# Patient Record
Sex: Female | Born: 1995 | Race: White | Hispanic: No | Marital: Single | Smoking: Never smoker
Health system: Southern US, Community
[De-identification: ages and names within clinical notes are randomized; demographics above are authoritative.]

---
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
## 2014-03-01 ENCOUNTER — Other Ambulatory Visit: Payer: Self-pay | Admitting: Family Medicine

## 2014-06-19 ENCOUNTER — Other Ambulatory Visit: Payer: Self-pay | Admitting: Family Medicine

## 2014-06-19 NOTE — Telephone Encounter (Signed)
Please schedule f/u and refill until then  

## 2014-06-19 NOTE — Telephone Encounter (Signed)
Electronic refill request, please advise  

## 2014-06-21 NOTE — Telephone Encounter (Signed)
Left voicemail requesting pt to call office back 

## 2014-06-22 NOTE — Telephone Encounter (Signed)
appt scheduled and med refilled 

## 2014-07-03 ENCOUNTER — Encounter: Payer: Self-pay | Admitting: Family Medicine

## 2014-07-03 ENCOUNTER — Ambulatory Visit (INDEPENDENT_AMBULATORY_CARE_PROVIDER_SITE_OTHER): Payer: 59 | Admitting: Family Medicine

## 2014-07-03 VITALS — BP 116/68 | HR 69 | Temp 98.7°F | Ht 66.25 in | Wt 172.8 lb

## 2014-07-03 DIAGNOSIS — Z23 Encounter for immunization: Secondary | ICD-10-CM

## 2014-07-03 DIAGNOSIS — Z113 Encounter for screening for infections with a predominantly sexual mode of transmission: Secondary | ICD-10-CM

## 2014-07-03 DIAGNOSIS — L700 Acne vulgaris: Secondary | ICD-10-CM

## 2014-07-03 MED ORDER — LEVONORGESTREL-ETHINYL ESTRAD 0.1-20 MG-MCG PO TABS: 1.0000 | ORAL_TABLET | Freq: Every day | ORAL | Status: DC

## 2014-07-03 NOTE — Progress Notes (Signed)
Pre visit review using our clinic review tool, if applicable. No additional management support is needed unless otherwise documented below in the visit note. 

## 2014-07-03 NOTE — Assessment & Plan Note (Signed)
Refilled OC -was improved  Now out of it and acne on chin returned Refilled pill  Rev need for condoms for STD prev

## 2014-07-03 NOTE — Progress Notes (Signed)
Subjective:    Patient ID: Danielle Mendoza, female    DOB: 11/13/1995, 18 y.o.   MRN: [id number]  HPI Here for f/u of acne and also for well health  She is in [employer]ology school and loves it  Working also   Will get a flu shot today   Is on aviane for acne  Ran out and her chin acne came back  She is sexually active and uses condoms  Would like std screening  - no symptoms or susp of dz   2 weeks without birth control - using condoms  No missed menses   Periods last about 4 days - not too heavy and not painful   Does eat healthy  Tries to stay active    Patient Active Problem List   Diagnosis Date Noted  . Mononucleosis syndrome 12/14/2013  . Well adolescent visit 11/24/2012  . Acne 11/11/2011  . CARDIAC MURMUR 09/05/2008  . ASTHMA 12/07/2006   No past medical history on file. No past surgical history on file. History  Substance Use Topics  . Smoking status: Never Smoker   . Smokeless tobacco: Never Used  . Alcohol Use: No   No family history on file. No Known Allergies Current Outpatient Prescriptions on File Prior to Visit  Medication Sig Dispense Refill  . AVIANE 0.1-20 MG-MCG tablet TAKE ONE TABLET BY MOUTH ONCE DAILY 84 tablet 0   No current facility-administered medications on file prior to visit.     Review of Systems Review of Systems  Constitutional: Negative for fever, appetite change, fatigue and unexpected weight change.  Eyes: Negative for pain and visual disturbance.  Respiratory: Negative for cough and shortness of breath.   Cardiovascular: Negative for cp or palpitations    Gastrointestinal: Negative for nausea, diarrhea and constipation.  Genitourinary: Negative for urgency and frequency.  Skin: Negative for pallor or rash  pos for acne Neurological: Negative for weakness, light-headedness, numbness and headaches.  Hematological: Negative for adenopathy. Does not bruise/bleed easily.  Psychiatric/Behavioral: Negative for dysphoric mood.  The patient is not nervous/anxious.         Objective:   Physical Exam  Constitutional: She appears well-developed and well-nourished. No distress.  HENT:  Head: Normocephalic and atraumatic.  Mouth/Throat: Oropharynx is clear and moist.  Eyes: Conjunctivae and EOM are normal. Pupils are equal, round, and reactive to light. No scleral icterus.  Neck: Normal range of motion. Neck supple. No thyromegaly present.  Cardiovascular: Normal rate and intact distal pulses.   Murmur heard. Pulmonary/Chest: Effort normal and breath sounds normal. No respiratory distress. She has no wheezes. She has no rales.  Abdominal: Soft. Bowel sounds are normal. She exhibits no distension and no mass. There is no tenderness.  No suprapubic tenderness or fullness    Musculoskeletal: She exhibits no edema.  Lymphadenopathy:    She has no cervical adenopathy.  Neurological: She is alert. She has normal reflexes. No cranial nerve deficit. She exhibits normal muscle tone. Coordination normal.  Skin: Skin is warm and dry. No pallor.  Mild comedonal acne on chin  Psychiatric: She has a normal mood and affect.          Assessment & Plan:   Problem List Items Addressed This Visit      Musculoskeletal and Integument   Acne - Primary    Refilled OC -was improved  Now out of it and acne on chin returned Refilled pill  Rev need for condoms for STD prev  Relevant Medications      levonorgestrel-ethinyl estradiol (AVIANE) 0.1-20 MG-MCG tablet     Other   Screen for STD (sexually transmitted disease)    Gc/chlam screen/urine  Disc use of condoms No symptoms     Relevant Orders      GC/chlamydia probe amp, urine    Other Visit Diagnoses    Need for prophylactic vaccination and inoculation against influenza        Relevant Orders       Flu Vaccine QUAD 36+ mos PF IM (Fluarix Quad PF) (Completed)

## 2014-07-03 NOTE — Assessment & Plan Note (Signed)
Gc/chlam screen/urine  Disc use of condoms No symptoms

## 2014-07-03 NOTE — Patient Instructions (Signed)
Continue current OC  Flu shot today  Urine for std screen today  Take care of yourself   If any problems let me know

## 2014-07-04 LAB — GC/CHLAMYDIA PROBE AMP, URINE
CHLAMYDIA, SWAB/URINE, PCR: NEGATIVE
GC Probe Amp, Urine: NEGATIVE

## 2014-07-05 ENCOUNTER — Telehealth: Payer: Self-pay | Admitting: Family Medicine

## 2014-07-05 NOTE — Telephone Encounter (Signed)
Addressed through result notes  

## 2014-07-05 NOTE — Telephone Encounter (Signed)
Patient returned your call.

## 2014-11-07 ENCOUNTER — Telehealth: Payer: Self-pay

## 2014-11-07 NOTE — Telephone Encounter (Signed)
Danielle Mendoza with [facility] [place] request refill Aviane. Advised to ck with pt who received a printed rx on 07/04/15. Danielle Mendoza voiced understanding.

## 2014-12-05 ENCOUNTER — Other Ambulatory Visit: Payer: Self-pay

## 2014-12-05 MED ORDER — LEVONORGESTREL-ETHINYL ESTRAD 0.1-20 MG-MCG PO TABS: 1.0000 | ORAL_TABLET | Freq: Every day | ORAL | Status: DC

## 2014-12-05 NOTE — Telephone Encounter (Signed)
Pts mother left v/m; pt received BC pill rx 07/03/14 but has lost the written rx. Now requesting refill aviane to [facility] [address] [place]. Pt now living in [place]. Request cb when refilled.

## 2014-12-05 NOTE — Telephone Encounter (Signed)
Will refill electronically  

## 2014-12-05 NOTE — Telephone Encounter (Signed)
Called mother's phone and no answer and no voicemail set up so called pt and left voicemail letting her know Rx sent to pharmacy

## 2016-05-21 ENCOUNTER — Ambulatory Visit (INDEPENDENT_AMBULATORY_CARE_PROVIDER_SITE_OTHER): Payer: No Typology Code available for payment source | Admitting: Family Medicine

## 2016-05-21 ENCOUNTER — Encounter: Payer: Self-pay | Admitting: Family Medicine

## 2016-05-21 VITALS — BP 120/64 | HR 88 | Temp 98.7°F | Ht 66.0 in | Wt 189.5 lb

## 2016-05-21 DIAGNOSIS — J029 Acute pharyngitis, unspecified: Secondary | ICD-10-CM | POA: Diagnosis not present

## 2016-05-21 DIAGNOSIS — J020 Streptococcal pharyngitis: Secondary | ICD-10-CM | POA: Insufficient documentation

## 2016-05-21 LAB — POCT RAPID STREP A (OFFICE): Rapid Strep A Screen: POSITIVE — AB

## 2016-05-21 MED ORDER — AMOXICILLIN 500 MG PO CAPS: 500.0000 mg | ORAL_CAPSULE | Freq: Three times a day (TID) | ORAL | 0 refills | Status: DC

## 2016-05-21 MED ORDER — LEVONORGESTREL-ETHINYL ESTRAD 0.1-20 MG-MCG PO TABS: 1.0000 | ORAL_TABLET | Freq: Every day | ORAL | 0 refills | Status: DC

## 2016-05-21 NOTE — Progress Notes (Signed)
   Subjective:    Patient ID: Danielle Mendoza, female    DOB: 09/17/1995, 20 y.o.   MRN: [id number]  HPI  Here with pharyngitis   Woke up yesterday with a bad ST  Has not checked temp  No chills Body has ached all over  Very tired  Lymph nodes in her neck are sore  A little cough today -not a lot   Can swallow  Drinking fluids  No tylenol or ibuprofen   Pos RST today Results for orders placed or performed in visit on 05/21/16  POCT rapid strep A  Result Value Ref Range   Rapid Strep A Screen Positive (A) Negative     Patient Active Problem List   Diagnosis Date Noted  . Strep pharyngitis 05/21/2016  . Screen for STD (sexually transmitted disease) 07/03/2014  . Mononucleosis syndrome 12/14/2013  . Well adolescent visit 11/24/2012  . Acne 11/11/2011  . CARDIAC MURMUR 09/05/2008  . ASTHMA 12/07/2006   No past medical history on file. No past surgical history on file. Social History  Substance Use Topics  . Smoking status: Never Smoker  . Smokeless tobacco: Never Used  . Alcohol use No   No family history on file. No Known Allergies No current outpatient prescriptions on file prior to visit.   No current facility-administered medications on file prior to visit.     Review of Systems    Review of Systems  Constitutional: Negative for fever, appetite change, fatigue and unexpected weight change.  Eyes: Negative for pain and visual disturbance.  Respiratory: Negative for cough and shortness of breath.   Cardiovascular: Negative for cp or palpitations    Gastrointestinal: Negative for nausea, diarrhea and constipation.  Genitourinary: Negative for urgency and frequency. pos for OC use - with problems remembering to take daily Skin: Negative for pallor or rash   Neurological: Negative for weakness, light-headedness, numbness and headaches.  Hematological: Negative for adenopathy. Does not bruise/bleed easily.  Psychiatric/Behavioral: Negative for dysphoric mood.  The patient is not nervous/anxious.      Objective:   Physical Exam  Constitutional: She appears well-developed and well-nourished. No distress.  HENT:  Head: Normocephalic and atraumatic.  Right Ear: External ear normal.  Left Ear: External ear normal.  Nose: Nose normal.  Erythematous throat with mildly enl tonsils No exudate or swelling    Eyes: Conjunctivae and EOM are normal. Pupils are equal, round, and reactive to light. Right eye exhibits no discharge. Left eye exhibits no discharge.  Neck: Normal range of motion. Neck supple.  Cardiovascular:  Murmur heard. Pulmonary/Chest: Effort normal and breath sounds normal. No respiratory distress. She has no wheezes. She has no rales.  Lymphadenopathy:    She has no cervical adenopathy.  Skin: Skin is warm and dry. No rash noted. No erythema. No pallor.  Psychiatric: She has a normal mood and affect.          Assessment & Plan:   Problem List Items Addressed This Visit      Respiratory   Strep pharyngitis    Cover with amoxicillin  Fluids/rest  Disc symptomatic care - see instructions on AVS  Update if not starting to improve in a week or if worsening   Out of work for 24 h with abx       Other Visit Diagnoses    Sore throat    -  Primary   Relevant Orders   POCT rapid strep A (Completed)

## 2016-05-21 NOTE — Progress Notes (Signed)
Pre visit review using our clinic review tool, if applicable. No additional management support is needed unless otherwise documented below in the visit note. 

## 2016-05-21 NOTE — Patient Instructions (Addendum)
Drink lots of fluids and rest  You have strep throat  Take acetaminophen or ibuprofen (with food ) for sore throat and fever  Take the amoxicillin as directed  Gargle with salt water Chloraseptic throat spray over the counter is helpful  Update if not starting to improve in a week or if worsening    I refilled your oral contraceptive Set an alarm on your phone to go off daily as a reminder  Either follow up with me for a visit for gyn exam or if you would rather see gyn (to consider another option)- let me know and I will refer you

## 2016-05-22 NOTE — Assessment & Plan Note (Signed)
Cover with amoxicillin  Fluids/rest  Disc symptomatic care - see instructions on AVS  Update if not starting to improve in a week or if worsening   Out of work for 24 h with abx

## 2016-11-03 ENCOUNTER — Encounter: Payer: Self-pay | Admitting: Family Medicine

## 2016-11-03 ENCOUNTER — Other Ambulatory Visit (HOSPITAL_COMMUNITY)
Admission: RE | Admit: 2016-11-03 | Discharge: 2016-11-03 | Disposition: A | Discharge status: Home / Self Care | Payer: No Typology Code available for payment source | Source: Ambulatory Visit | Attending: Family Medicine | Admitting: Family Medicine

## 2016-11-03 ENCOUNTER — Ambulatory Visit (INDEPENDENT_AMBULATORY_CARE_PROVIDER_SITE_OTHER): Payer: No Typology Code available for payment source | Admitting: Family Medicine

## 2016-11-03 VITALS — BP 102/62 | HR 72 | Temp 98.2°F | Ht 66.5 in | Wt 189.0 lb

## 2016-11-03 DIAGNOSIS — Z0000 Encounter for general adult medical examination without abnormal findings: Secondary | ICD-10-CM

## 2016-11-03 DIAGNOSIS — Z23 Encounter for immunization: Secondary | ICD-10-CM

## 2016-11-03 DIAGNOSIS — Z01419 Encounter for gynecological examination (general) (routine) without abnormal findings: Secondary | ICD-10-CM

## 2016-11-03 DIAGNOSIS — Z113 Encounter for screening for infections with a predominantly sexual mode of transmission: Secondary | ICD-10-CM | POA: Diagnosis not present

## 2016-11-03 MED ORDER — LEVONORGESTREL-ETHINYL ESTRAD 0.1-20 MG-MCG PO TABS: 1.0000 | ORAL_TABLET | Freq: Every day | ORAL | 3 refills | Status: DC

## 2016-11-03 NOTE — Assessment & Plan Note (Signed)
No symptoms  Not sexually active now  gc/chlamydia tests added to pap HIV and RPR in blood draw

## 2016-11-03 NOTE — Progress Notes (Signed)
Pre visit review using our clinic review tool, if applicable. No additional management support is needed unless otherwise documented below in the visit note. 

## 2016-11-03 NOTE — Progress Notes (Signed)
Subjective:    Patient ID: Danielle Mendoza, female    DOB: Sep 21, 1995, 21 y.o.   MRN: [id number]  HPI  Here for health maintenance exam and to review chronic medical problems    Working - not a lot going on  At a salon in [place]  Getting to busy season - enjoys it for the most part   Wt Readings from Last 3 Encounters:  11/03/16 189 lb (85.7 kg)  05/21/16 189 lb 8 oz (86 kg)  07/03/14 172 lb 12 oz (78.4 kg) (93 %, Z= 1.46)*   * Growth percentiles are based on CDC 2-20 Years data.   bmi 30.0 Eating healthy- making improvements - quit soda/ lower carb /more water  Eating better carbs  Going to the gym 3 times per week- likes cardio     Gyn care-wants to get that done today She is not sexually active now (was in the past)  Interested in std testing -no symptoms Menses -normal lasting approx 5 days at most , not heavy or painful  On aviane -likes that one   Had HPV vaccines   HIV screening   Flu shot - did not get one this past season   Tetanus shot 10/08- will get done today   Patient Active Problem List   Diagnosis Date Noted  . Routine general medical examination at a health care facility 11/03/2016  . Encounter for routine gynecological examination 11/03/2016  . Screen for STD (sexually transmitted disease) 07/03/2014  . Mononucleosis syndrome 12/14/2013  . Well adolescent visit 11/24/2012  . Acne 11/11/2011  . CARDIAC MURMUR 09/05/2008  . ASTHMA 12/07/2006   No past medical history on file. No past surgical history on file. Social History  Substance Use Topics  . Smoking status: Never Smoker  . Smokeless tobacco: Never Used  . Alcohol use No   No family history on file. No Known Allergies No current outpatient prescriptions on file prior to visit.   No current facility-administered medications on file prior to visit.     Review of Systems Review of Systems  Constitutional: Negative for fever, appetite change, fatigue and unexpected weight  change.  Eyes: Negative for pain and visual disturbance.  Respiratory: Negative for cough and shortness of breath.   Cardiovascular: Negative for cp or palpitations    Gastrointestinal: Negative for nausea, diarrhea and constipation.  Genitourinary: Negative for urgency and frequency.  Skin: Negative for pallor or rash   Neurological: Negative for weakness, light-headedness, numbness and headaches.  Hematological: Negative for adenopathy. Does not bruise/bleed easily.  Psychiatric/Behavioral: Negative for dysphoric mood. The patient is not nervous/anxious.         Objective:   Physical Exam  Constitutional: She appears well-developed and well-nourished. No distress.  overwt and well app  HENT:  Head: Normocephalic and atraumatic.  Right Ear: External ear normal.  Left Ear: External ear normal.  Mouth/Throat: Oropharynx is clear and moist.  Eyes: Conjunctivae and EOM are normal. Pupils are equal, round, and reactive to light. No scleral icterus.  Neck: Normal range of motion. Neck supple. No JVD present. Carotid bruit is not present. No thyromegaly present.  Cardiovascular: Normal rate, regular rhythm and intact distal pulses.  Exam reveals no gallop.   Murmur heard. Pulmonary/Chest: Effort normal and breath sounds normal. No respiratory distress. She has no wheezes. She exhibits no tenderness.  Abdominal: Soft. Bowel sounds are normal. She exhibits no distension, no abdominal bruit and no mass. There is no tenderness.  Genitourinary:  No breast swelling, tenderness, discharge or bleeding.  Genitourinary Comments: Breast exam: No mass, nodules, thickening, tenderness, bulging, retraction, inflamation, nipple discharge or skin changes noted.  No axillary or clavicular LA.             Anus appears normal w/o hemorrhoids or masses     External genitalia : nl appearance and hair distribution/no lesions     Urethral meatus : nl size, no lesions or prolapse     Urethra: no  masses, tenderness or scarring    Bladder : no masses or tenderness     Vagina: nl general appearance, no discharge or  Lesions, no significant cystocele  or rectocele     Cervix: no lesions/ discharge or friability    Uterus: nl size, contour, position, and mobility (not fixed) , non tender    Adnexa : no masses, tenderness, enlargement or nodularity        Musculoskeletal: Normal range of motion. She exhibits no edema or tenderness.  Lymphadenopathy:    She has no cervical adenopathy.  Neurological: She is alert. She has normal reflexes. No cranial nerve deficit. She exhibits normal muscle tone. Coordination normal.  Skin: Skin is warm and dry. No rash noted. No erythema. No pallor.  Solar lentigines diffusely    Psychiatric: She has a normal mood and affect.          Assessment & Plan:   Problem List Items Addressed This Visit      Other   Encounter for routine gynecological examination    Routine gyn exam with first pap no c/o  Has had hpv vaccines Std screening done  Will continue current OC Disc std prev      Relevant Orders   Cytology - PAP   Routine general medical examination at a health care facility - Primary    Reviewed health habits including diet and exercise and skin cancer prevention Reviewed appropriate screening tests for age  Also reviewed health mt list, fam hx and immunization status , as well as social and family history   See hpi Wellness labs today STD screening  Disc healthy habits        Relevant Orders   CBC with Differential/Platelet   Comprehensive metabolic panel   Lipid panel   TSH   Screen for STD (sexually transmitted disease)    No symptoms  Not sexually active now  gc/chlamydia tests added to pap HIV and RPR in blood draw       Relevant Orders   HIV antibody   RPR    Other Visit Diagnoses    Need for Tdap vaccination       Relevant Orders   Tdap vaccine greater than or equal to 7yo IM (Completed)

## 2016-11-03 NOTE — Assessment & Plan Note (Signed)
Reviewed health habits including diet and exercise and skin cancer prevention Reviewed appropriate screening tests for age  Also reviewed health mt list, fam hx and immunization status , as well as social and family history   See hpi Wellness labs today STD screening  Disc healthy habits

## 2016-11-03 NOTE — Assessment & Plan Note (Signed)
Routine gyn exam with first pap no c/o  Has had hpv vaccines Std screening done  Will continue current OC Disc std prev

## 2016-11-03 NOTE — Patient Instructions (Addendum)
Take care of yourself- eat a healthy diet and keep exercising Labs today for wellness Also for STD screen  Pap and gyn exam today  Continue current oral contraceptive-I sent it to [facility] Tdap vaccine today

## 2016-11-04 LAB — LIPID PANEL
Cholesterol: 132 mg/dL (ref 0–200)
HDL: 45 mg/dL (ref >39.00)
LDL Cholesterol: 69 mg/dL (ref 0–99)
NONHDL: 87.3
TRIGLYCERIDES: 90 mg/dL (ref 0.0–149.0)
Total CHOL/HDL Ratio: 3
VLDL: 18 mg/dL (ref 0.0–40.0)

## 2016-11-04 LAB — COMPREHENSIVE METABOLIC PANEL
ALK PHOS: 39 U/L (ref 39–117)
ALT: 13 U/L (ref 0–35)
AST: 17 U/L (ref 0–37)
Albumin: 4.3 g/dL (ref 3.5–5.2)
BILIRUBIN TOTAL: 0.3 mg/dL (ref 0.2–1.2)
BUN: 11 mg/dL (ref 6–23)
CALCIUM: 9.3 mg/dL (ref 8.4–10.5)
CO2: 28 mEq/L (ref 19–32)
Chloride: 107 mEq/L (ref 96–112)
Creatinine, Ser: 0.67 mg/dL (ref 0.40–1.20)
GFR: 117.22 mL/min (ref >60.00)
GLUCOSE: 99 mg/dL (ref 70–99)
Potassium: 4 mEq/L (ref 3.5–5.1)
Sodium: 141 mEq/L (ref 135–145)
TOTAL PROTEIN: 7 g/dL (ref 6.0–8.3)

## 2016-11-04 LAB — CBC WITH DIFFERENTIAL/PLATELET
BASOS ABS: 0.1 10*3/uL (ref 0.0–0.1)
Basophils Relative: 0.6 % (ref 0.0–3.0)
Eosinophils Absolute: 0.3 10*3/uL (ref 0.0–0.7)
Eosinophils Relative: 3.3 % (ref 0.0–5.0)
HCT: 39.2 % (ref 36.0–46.0)
Hemoglobin: 12.8 g/dL (ref 12.0–15.0)
Lymphocytes Relative: 27.4 % (ref 12.0–46.0)
Lymphs Abs: 2.6 10*3/uL (ref 0.7–4.0)
MCHC: 32.6 g/dL (ref 30.0–36.0)
MCV: 87.4 fl (ref 78.0–100.0)
MONO ABS: 0.5 10*3/uL (ref 0.1–1.0)
Monocytes Relative: 5.6 % (ref 3.0–12.0)
Neutro Abs: 6.1 10*3/uL (ref 1.4–7.7)
Neutrophils Relative %: 63.1 % (ref 43.0–77.0)
Platelets: 218 10*3/uL (ref 150.0–400.0)
RBC: 4.49 Mil/uL (ref 3.87–5.11)
RDW: 13.9 % (ref 11.5–15.5)
WBC: 9.6 10*3/uL (ref 4.0–10.5)

## 2016-11-04 LAB — CYTOLOGY - PAP
Chlamydia: NEGATIVE
DIAGNOSIS: NEGATIVE
NEISSERIA GONORRHEA: NEGATIVE

## 2016-11-04 LAB — RPR

## 2016-11-04 LAB — TSH: TSH: 1.14 u[IU]/mL (ref 0.35–4.50)

## 2016-11-04 LAB — HIV ANTIBODY (ROUTINE TESTING W REFLEX): HIV 1&2 Ab, 4th Generation: NONREACTIVE

## 2017-05-13 ENCOUNTER — Ambulatory Visit (INDEPENDENT_AMBULATORY_CARE_PROVIDER_SITE_OTHER): Payer: No Typology Code available for payment source | Admitting: Primary Care

## 2017-05-13 ENCOUNTER — Encounter: Payer: Self-pay | Admitting: Primary Care

## 2017-05-13 VITALS — BP 122/76 | HR 91 | Temp 98.2°F | Ht 66.5 in | Wt 189.4 lb

## 2017-05-13 DIAGNOSIS — J029 Acute pharyngitis, unspecified: Secondary | ICD-10-CM | POA: Diagnosis not present

## 2017-05-13 DIAGNOSIS — J069 Acute upper respiratory infection, unspecified: Secondary | ICD-10-CM

## 2017-05-13 LAB — POCT RAPID STREP A (OFFICE): Rapid Strep A Screen: NEGATIVE

## 2017-05-13 NOTE — Addendum Note (Signed)
Addended by: Tawnya CrookSAMBATH, Morgane Joerger on: 05/13/2017 12:55 PM   Modules accepted: Orders

## 2017-05-13 NOTE — Progress Notes (Signed)
   Subjective:    Patient ID: Danielle Mendoza E Schumm, female    DOB: 10/25/1995, 21 y.o.   MRN: [id number]  HPI  Ms. Joesphine BareGreeson is a 21 year old female who presents today with a chief complaint of sore throat. She also reports body aches, nasal congestion, chills, body aches. Her symptoms began 2 days ago. She denies cough, fevers, sick contacts, sinus pressure. She's taken Advil with some improvement with headache. Her most bothersome symptom is sore throat.  Review of Systems  Constitutional: Positive for chills and fatigue. Negative for fever.  HENT: Positive for congestion and sore throat. Negative for ear pain and sinus pressure.   Respiratory: Negative for cough and shortness of breath.   Allergic/Immunologic: Positive for environmental allergies.       No past medical history on file.   Social History   Social History  . Marital status: Single    Spouse name: N/A  . Number of children: N/A  . Years of education: N/A   Occupational History  . Not on file.   Social History Main Topics  . Smoking status: Never Smoker  . Smokeless tobacco: Never Used  . Alcohol use No  . Drug use: No  . Sexual activity: Not on file   Other Topics Concern  . Not on file   Social History Narrative  . No narrative on file    No past surgical history on file.  No family history on file.  No Known Allergies  Current Outpatient Prescriptions on File Prior to Visit  Medication Sig Dispense Refill  . levonorgestrel-ethinyl estradiol (AVIANE) 0.1-20 MG-MCG tablet Take 1 tablet by mouth daily. 84 tablet 3   No current facility-administered medications on file prior to visit.     BP 122/76   Pulse 91   Temp 98.2 F (36.8 C) (Oral)   Ht 5' 6.5" (1.689 m)   Wt 189 lb 6.4 oz (85.9 kg)   LMP 05/13/2017   SpO2 98%   BMI 30.11 kg/m    Objective:   Physical Exam  Constitutional: She appears well-nourished. She does not appear ill.  HENT:  Right Ear: Ear canal normal. Tympanic membrane is  not erythematous. A middle ear effusion is present.  Left Ear: Ear canal normal. Tympanic membrane is not erythematous. A middle ear effusion is present.  Nose: Mucosal edema present. Right sinus exhibits no maxillary sinus tenderness and no frontal sinus tenderness. Left sinus exhibits no maxillary sinus tenderness and no frontal sinus tenderness.  Mouth/Throat: Posterior oropharyngeal erythema present. No oropharyngeal exudate or posterior oropharyngeal edema.  Eyes: Conjunctivae are normal.  Neck: Neck supple.  Cardiovascular: Normal rate and regular rhythm.   Pulmonary/Chest: Effort normal and breath sounds normal. She has no wheezes. She has no rales.  Lymphadenopathy:    She has no cervical adenopathy.  Skin: Skin is warm and dry.          Assessment & Plan:  URI:  Sore throat, chills, body aches x 2 days. Exam today consistent for viral involvement without influenza. Rapid Strep: Negative. Discussed antihistamine/flonase use for ear effusion. Tylenol or Advil for body aches, headaches. Discussed that symptoms may get worse before better and return precautions provided if no improvement. Fluids, rest.  Morrie Sheldonlark,Glada Wickstrom Kendal, NP

## 2017-05-13 NOTE — Patient Instructions (Signed)
Your symptoms are representative of a viral illness which will resolve on its own over time. Our goal is to treat your symptoms in order to aid your body in the healing process and to make you more comfortable.   Consider starting an antihistamine such as Claritin, Zyrtec, or Allegra for fluid in the ears.   Nasal Congestion/Ear Pressure: Try using Flonase (fluticasone) nasal spray. Instill 1 spray in each nostril twice daily.   Continue Advil or Tylenol as needed for headaches and body aches. Do not exceed 2400 mg of Advil or 3000 mg of tylenol in 24 hours.  Please notify me if you develop persistent fevers of 101, start coughing up green mucous, notice increased fatigue or weakness, or feel worse after 1 week of onset of symptoms.   Increase consumption of water intake and rest.  It was a pleasure meeting you!   Upper Respiratory Infection, Adult Most upper respiratory infections (URIs) are a viral infection of the air passages leading to the lungs. A URI affects the nose, throat, and upper air passages. The most common type of URI is nasopharyngitis and is typically referred to as "the common cold." URIs run their course and usually go away on their own. Most of the time, a URI does not require medical attention, but sometimes a bacterial infection in the upper airways can follow a viral infection. This is called a secondary infection. Sinus and middle ear infections are common types of secondary upper respiratory infections. Bacterial pneumonia can also complicate a URI. A URI can worsen asthma and chronic obstructive pulmonary disease (COPD). Sometimes, these complications can require emergency medical care and may be life threatening. What are the causes? Almost all URIs are caused by viruses. A virus is a type of germ and can spread from one person to another. What increases the risk? You may be at risk for a URI if:  You smoke.  You have chronic heart or lung disease.  You have a  weakened defense (immune) system.  You are very young or very old.  You have nasal allergies or asthma.  You work in crowded or poorly ventilated areas.  You work in health care facilities or schools.  What are the signs or symptoms? Symptoms typically develop 2-3 days after you come in contact with a cold virus. Most viral URIs last 7-10 days. However, viral URIs from the influenza virus (flu virus) can last 14-18 days and are typically more severe. Symptoms may include:  Runny or stuffy (congested) nose.  Sneezing.  Cough.  Sore throat.  Headache.  Fatigue.  Fever.  Loss of appetite.  Pain in your forehead, behind your eyes, and over your cheekbones (sinus pain).  Muscle aches.  How is this diagnosed? Your health care provider may diagnose a URI by:  Physical exam.  Tests to check that your symptoms are not due to another condition such as: ? Strep throat. ? Sinusitis. ? Pneumonia. ? Asthma.  How is this treated? A URI goes away on its own with time. It cannot be cured with medicines, but medicines may be prescribed or recommended to relieve symptoms. Medicines may help:  Reduce your fever.  Reduce your cough.  Relieve nasal congestion.  Follow these instructions at home:  Take medicines only as directed by your health care provider.  Gargle warm saltwater or take cough drops to comfort your throat as directed by your health care provider.  Use a warm mist humidifier or inhale steam from a shower  to increase air moisture. This may make it easier to breathe.  Drink enough fluid to keep your urine clear or pale yellow.  Eat soups and other clear broths and maintain good nutrition.  Rest as needed.  Return to work when your temperature has returned to normal or as your health care provider advises. You may need to stay home longer to avoid infecting others. You can also use a face mask and careful hand washing to prevent spread of the  virus.  Increase the usage of your inhaler if you have asthma.  Do not use any tobacco products, including cigarettes, chewing tobacco, or electronic cigarettes. If you need help quitting, ask your health care provider. How is this prevented? The best way to protect yourself from getting a cold is to practice good hygiene.  Avoid oral or hand contact with people with cold symptoms.  Wash your hands often if contact occurs.  There is no clear evidence that vitamin C, vitamin E, echinacea, or exercise reduces the chance of developing a cold. However, it is always recommended to get plenty of rest, exercise, and practice good nutrition. Contact a health care provider if:  You are getting worse rather than better.  Your symptoms are not controlled by medicine.  You have chills.  You have worsening shortness of breath.  You have brown or red mucus.  You have yellow or brown nasal discharge.  You have pain in your face, especially when you bend forward.  You have a fever.  You have swollen neck glands.  You have pain while swallowing.  You have white areas in the back of your throat. Get help right away if:  You have severe or persistent: ? Headache. ? Ear pain. ? Sinus pain. ? Chest pain.  You have chronic lung disease and any of the following: ? Wheezing. ? Prolonged cough. ? Coughing up blood. ? A change in your usual mucus.  You have a stiff neck.  You have changes in your: ? Vision. ? Hearing. ? Thinking. ? Mood. This information is not intended to replace advice given to you by your health care provider. Make sure you discuss any questions you have with your health care provider. Document Released: 12/31/2000 Document Revised: 03/09/2016 Document Reviewed: 10/12/2013 Elsevier Interactive Patient Education  2017 [employer].

## 2017-06-18 DIAGNOSIS — T7840XA Allergy, unspecified, initial encounter: Secondary | ICD-10-CM | POA: Insufficient documentation

## 2017-06-18 DIAGNOSIS — J343 Hypertrophy of nasal turbinates: Secondary | ICD-10-CM | POA: Insufficient documentation

## 2017-11-09 ENCOUNTER — Telehealth: Payer: Self-pay

## 2017-11-09 ENCOUNTER — Encounter: Payer: Self-pay | Admitting: Family Medicine

## 2017-11-09 NOTE — Telephone Encounter (Signed)
Thanks - please cancel the appt  Wish I would have known earlier and we could have filled her slot Thanks

## 2017-11-09 NOTE — Telephone Encounter (Signed)
PLEASE NOTE: All timestamps contained within this report are represented as [place] Standard Time. CONFIDENTIALTY NOTICE: This fax transmission is intended only for the addressee. It contains information that is legally privileged, confidential or otherwise protected from use or disclosure. If you are not the intended recipient, you are strictly prohibited from reviewing, disclosing, copying using or disseminating any of this information or taking any action in reliance on or regarding this information. If you have received this fax in error, please notify us immediately by telephone so that we can arrange for its return to us. Phone: [phone], Toll-Free: [phone], Fax: [phone] Page: 1 of 1 Call Id: [id number] [facility] - Client Nonclinical Telephone Record [facility] Client [facility] - Night Contact Type Call Who Is Calling Patient / Member / Family / Caregiver Caller Name Danielle Mendoza Caller Phone Number [phone] Patient Name Danielle Mendoza Patient DOB [birth year] Call Type Message Only Information Provided Reason for Call Request to Reschedule Office Appointment Initial Comment Caller states that she has an appt tomorrow and is needing to reschedule her appt. Additional Comment Call Closed By: Jackelyn HoehnLorena Gonzalez Transaction Date/Time: 11/08/2017 7:26:42 PM (ET)

## 2017-11-09 NOTE — Telephone Encounter (Signed)
I left a message on patient's voice mail to call back and reschedule cpx appointment.  Dr.Tower do you want me to cancel today's appointment or leave it on your schedule?

## 2018-01-03 DIAGNOSIS — J019 Acute sinusitis, unspecified: Secondary | ICD-10-CM | POA: Diagnosis not present

## 2018-06-04 ENCOUNTER — Telehealth: Payer: Self-pay

## 2018-06-04 NOTE — Telephone Encounter (Signed)
Irving Burtonmily at front desk said my chart note that pt had scheduled a mychart physical on 06/08/18 for annual exam, birth control and shortness of breath concerns. I spoke with  pt and she said she has hx of asthma, for 2 months pt has been coughing; for 3 weeks pt has felt like her breathing is restricted. Pt does not sound in any distress. Pt said she is OK right now. Pt said she does not think when happens that she is in any distress but she can tell a difference in her breathing being more restricted. Pt does not think the breathing is bad enough that she needs to be seen prior to 06/08/18; 06/08/18 is first appt that was acceptable with pts work schedule. Pt called back and would like [facility] and on and off restricted breathing. If pt condition changes or worsens prior to appt [facility] FYI to [facility] Sh[facility] HonesGutierrez [facility] Dr Milinda Antisower as PCP.

## 2018-06-05 ENCOUNTER — Ambulatory Visit: Payer: Self-pay | Admitting: Family Medicine

## 2018-06-08 ENCOUNTER — Ambulatory Visit (INDEPENDENT_AMBULATORY_CARE_PROVIDER_SITE_OTHER): Payer: Commercial Managed Care - PPO | Admitting: Family Medicine

## 2018-06-08 ENCOUNTER — Other Ambulatory Visit (HOSPITAL_COMMUNITY)
Admission: RE | Admit: 2018-06-08 | Discharge: 2018-06-08 | Disposition: A | Discharge status: Home / Self Care | Payer: Commercial Managed Care - PPO | Source: Ambulatory Visit | Attending: Family Medicine | Admitting: Family Medicine

## 2018-06-08 ENCOUNTER — Encounter: Payer: Self-pay | Admitting: Family Medicine

## 2018-06-08 VITALS — BP 104/66 | HR 72 | Temp 97.9°F | Ht 66.25 in | Wt 195.5 lb

## 2018-06-08 DIAGNOSIS — Z23 Encounter for immunization: Secondary | ICD-10-CM | POA: Diagnosis not present

## 2018-06-08 DIAGNOSIS — Z01419 Encounter for gynecological examination (general) (routine) without abnormal findings: Secondary | ICD-10-CM | POA: Diagnosis not present

## 2018-06-08 DIAGNOSIS — N6315 Unspecified lump in the right breast, overlapping quadrants: Secondary | ICD-10-CM | POA: Insufficient documentation

## 2018-06-08 DIAGNOSIS — J4520 Mild intermittent asthma, uncomplicated: Secondary | ICD-10-CM | POA: Diagnosis not present

## 2018-06-08 DIAGNOSIS — Z0000 Encounter for general adult medical examination without abnormal findings: Secondary | ICD-10-CM

## 2018-06-08 DIAGNOSIS — Z113 Encounter for screening for infections with a predominantly sexual mode of transmission: Secondary | ICD-10-CM | POA: Diagnosis not present

## 2018-06-08 DIAGNOSIS — N6310 Unspecified lump in the right breast, unspecified quadrant: Secondary | ICD-10-CM

## 2018-06-08 MED ORDER — ALBUTEROL SULFATE HFA 108 (90 BASE) MCG/ACT IN AERS: 2.0000 | INHALATION_SPRAY | RESPIRATORY_TRACT | 11 refills | Status: DC | PRN

## 2018-06-08 MED ORDER — LEVONORGESTREL-ETHINYL ESTRAD 0.1-20 MG-MCG PO TABS: 1.0000 | ORAL_TABLET | Freq: Every day | ORAL | 3 refills | Status: DC

## 2018-06-08 NOTE — Assessment & Plan Note (Signed)
Reviewed health habits including diet and exercise and skin cancer prevention Reviewed appropriate screening tests for age  Also reviewed health mt list, fam hx and immunization status , as well as social and family history   See HPI Labs rev from last PE Gyn exam done with pap  Ref for US for breast lump on exam  Disc safe sexual practices and STD screening done

## 2018-06-08 NOTE — Assessment & Plan Note (Signed)
No symptoms  Enc safe sexual practices HIV , RPR, gc, chlamydia screen today as well as pap  Pt has had HPV vaccines

## 2018-06-08 NOTE — Assessment & Plan Note (Signed)
Suspect cyst  Ref done for [place]

## 2018-06-08 NOTE — Assessment & Plan Note (Signed)
Exam and pap done  Breast lump felt in L lat breast- US ordered  STD screen as well (gc/chlamydia) Refilled OC

## 2018-06-08 NOTE — Progress Notes (Signed)
Subjective:    Patient ID: Danielle Mendoza, female    DOB: 10/11/1995, 22 y.o.   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 If pollen allergies     Wt Readings from Last 3 Encounters:  06/08/18 195 lb 8 oz (88.7 kg)  05/13/17 189 lb 6.4 oz (85.9 kg)  11/03/16 189 lb (85.7 kg)  exercise- less due to her asthma lately / before that did cardio and some squats  Diet - working on it (good days and bad days)  31.32 kg/m   Sweets are her biggest problem  Hard to to eat on the go   Flu vaccine-plans to get today    Pap 4/18 -negative (was also screened for gc/chl) She has had the HPV vaccine series  OC- on aviane 1/20-wants to stick with it  Periods are fine - last about 4-5 days (not very heavy) , not painful or crampy occ sexually active   Wants STD screening   Tetanus shot 4/18   HIV screen neg 4/18  Lab profile in 2018 was unremarkable Lab Results  Component Value Date   CHOL 132 11/03/2016   HDL 45.00 11/03/2016   LDLCALC 69 11/03/2016   TRIG 90.0 11/03/2016   CHOLHDL 3 11/03/2016   Patient Active Problem List   Diagnosis Date Noted  . Breast lump on right side at 3 o'clock position 06/08/2018  . Routine general medical examination at a health care facility 11/03/2016  . Encounter for routine gynecological examination 11/03/2016  . Screen for STD (sexually transmitted disease) 07/03/2014  . Well adolescent  visit 11/24/2012  . Acne 11/11/2011  . CARDIAC MURMUR 09/05/2008  . Asthma 12/07/2006   History reviewed. No pertinent past medical history. History reviewed. No pertinent surgical history. Social History   Tobacco Use  . Smoking status: Never Smoker  . Smokeless tobacco: Never Used  Substance Use Topics  . Alcohol use: No    Alcohol/week: 0.0 standard drinks  . Drug use: No   History reviewed. No pertinent family history. No Known Allergies Current Outpatient Medications on File Prior to Visit  Medication Sig Dispense Refill  . fluticasone (FLONASE) 50 MCG/ACT nasal spray Place 1 spray into both nostrils daily as needed for allergies or rhinitis.     No current facility-administered medications on file prior to visit.     Review of Systems  Constitutional: Negative for activity change, appetite change, fatigue, fever and unexpected weight change.  HENT: Negative for congestion, ear pain, rhinorrhea, sinus pressure and sore throat.   Eyes: Negative for pain, redness and visual disturbance.  Respiratory: Positive for cough, chest tightness and shortness of breath. Negative for wheezing.   Cardiovascular: Negative for chest pain and palpitations.  Gastrointestinal: Negative for abdominal pain, blood in stool, constipation and diarrhea.  Endocrine: Negative  for polydipsia and polyuria.  Genitourinary: Negative for dysuria, frequency and urgency.  Musculoskeletal: Negative for arthralgias, back pain and myalgias.  Skin: Negative for pallor and rash.  Allergic/Immunologic: Negative for environmental allergies.  Neurological: Negative for dizziness, syncope and headaches.  Hematological: Negative for adenopathy. Does not bruise/bleed easily.  Psychiatric/Behavioral: Negative for decreased concentration and dysphoric mood. The patient is not nervous/anxious.        Objective:   Physical Exam  Constitutional: She appears well-developed and well-nourished. No distress.  obese and  well appearing   HENT:  Head: Normocephalic and atraumatic.  Right Ear: External ear normal.  Left Ear: External ear normal.  Mouth/Throat: Oropharynx is clear and moist.  Eyes: Pupils are equal, round, and reactive to light. Conjunctivae and EOM are normal. No scleral icterus.  Neck: Normal range of motion. Neck supple. No JVD present. Carotid bruit is not present. No thyromegaly present.  Cardiovascular: Normal rate, regular rhythm, normal heart sounds and intact distal pulses. Exam reveals no gallop.  No murmur heard. Murmur was not heard today despite hx of one  Pulmonary/Chest: Effort normal and breath sounds normal. No stridor. No respiratory distress. She has no wheezes. She has no rales. No breast tenderness, discharge or bleeding.  Good air exch No wheeze even on forced expiration   Abdominal: Soft. Bowel sounds are normal. She exhibits no distension, no abdominal bruit and no mass. There is no tenderness.  Genitourinary: No breast tenderness, discharge or bleeding.  Genitourinary Comments: Breast exam: No  nodules, thickening, tenderness, bulging, retraction, inflamation, nipple discharge or skin changes noted.  No axillary or clavicular LA.     L breast- mobile mass felt-smooth (approx 1 cm) -non tender at 3:00 position       Anus appears normal w/o hemorrhoids or masses     External genitalia : nl appearance and hair distribution/no lesions     Urethral meatus : nl size, no lesions or prolapse     Urethra: no masses, tenderness or scarring    Bladder : no masses or tenderness     Vagina: nl general appearance, no discharge or  Lesions, no significant cystocele  or rectocele     Cervix: no lesions/ discharge or friability    Uterus: nl size, contour, position, and mobility (not fixed) , non tender    Adnexa : no masses, tenderness, enlargement or nodularity        Musculoskeletal: Normal range of motion. She exhibits no edema or tenderness.    Lymphadenopathy:    She has no cervical adenopathy.  Neurological: She is alert. She has normal reflexes. No cranial nerve deficit. She exhibits normal muscle tone. Coordination normal.  Skin: Skin is warm and dry. No rash noted. No erythema. No pallor.  Mild facial acne   Psychiatric: She has a normal mood and affect.  Pleasant           Assessment & Plan:   Problem List Items Addressed This Visit      Respiratory   Asthma    Cough variant- worse lately  Px albuterol mdi to use prior to exercise and prn cough or sob  Nl exam today  If no imp will f/u (consider spirometry)       Relevant Medications   albuterol (PROVENTIL HFA;VENTOLIN HFA) 108 (90 Base) MCG/ACT inhaler     Other   Screen for STD (sexually transmitted disease)    No symptoms  Enc safe sexual practices HIV , RPR, gc, chlamydia screen today  as well as pap  Pt has had HPV vaccines       Relevant Orders   HIV Antibody (routine testing w rflx)   RPR   Routine general medical examination at a health care facility - Primary    Reviewed health habits including diet and exercise and skin cancer prevention Reviewed appropriate screening tests for age  Also reviewed health mt list, fam hx and immunization status , as well as social and family history   See HPI Labs rev from last PE Gyn exam done with pap  Ref for US for breast lump on exam  Disc safe sexual practices and STD screening done      Encounter for routine gynecological examination    Exam and pap done  Breast lump felt in L lat breast- US ordered  STD screen as well (gc/chlamydia) Refilled OC       Relevant Orders   Cytology - PAP   Breast lump on right side at 3 o'clock position    Suspect cyst  Ref done for US       Relevant Orders   US BREAST LTD UNI LEFT INC AXILLA    Other Visit Diagnoses    Need for influenza vaccination       Relevant Orders   Flu Vaccine QUAD 6+ mos PF IM (Fluarix Quad PF) (Completed)

## 2018-06-08 NOTE — Patient Instructions (Addendum)
Exam and pap done today  Labs for STD screening today  Take care of yourself   Flu shot today  I will refer you for a breast ultrasound for the lump in the left side (suspect a cyst)   Use the inhaler 2 puffs before exercise  As needed for shortness of breath or wheezing or cough   Follow up if not improving

## 2018-06-08 NOTE — Assessment & Plan Note (Signed)
Cough variant- worse lately  Px albuterol mdi to use prior to exercise and prn cough or sob  Nl exam today  If no imp will f/u (consider spirometry)

## 2018-06-09 LAB — RPR: RPR Ser Ql: NONREACTIVE

## 2018-06-09 LAB — HIV ANTIBODY (ROUTINE TESTING W REFLEX): HIV: NONREACTIVE

## 2018-06-10 ENCOUNTER — Telehealth: Payer: Self-pay | Admitting: *Deleted

## 2018-06-10 LAB — CYTOLOGY - PAP
Chlamydia: NEGATIVE
Diagnosis: NEGATIVE
Neisseria Gonorrhea: NEGATIVE

## 2018-06-10 MED ORDER — FLUCONAZOLE 150 MG PO TABS: 150.0000 mg | ORAL_TABLET | Freq: Once | ORAL | 0 refills | Status: AC

## 2018-06-10 NOTE — Telephone Encounter (Signed)
Rx sent and pt notified.

## 2018-06-10 NOTE — Telephone Encounter (Signed)
Called pt but error with VM so I couldn't leave a VM regarding her labs

## 2018-06-10 NOTE — Telephone Encounter (Signed)
-----   Message from Judy PimpleMarne A Tower, MD sent at 06/10/2018  2:32 PM EST ----- Yeast on pap  Please send diflucan 150 mg 1 po times 1 #1no ref to her pref pharmacy and let her know

## 2018-06-23 ENCOUNTER — Ambulatory Visit
Admission: RE | Admit: 2018-06-23 | Discharge: 2018-06-23 | Disposition: A | Discharge status: Home / Self Care | Payer: Commercial Managed Care - PPO | Source: Ambulatory Visit | Attending: Family Medicine | Admitting: Family Medicine

## 2018-06-23 DIAGNOSIS — N6310 Unspecified lump in the right breast, unspecified quadrant: Principal | ICD-10-CM

## 2018-06-23 DIAGNOSIS — N6489 Other specified disorders of breast: Secondary | ICD-10-CM | POA: Diagnosis not present

## 2018-06-23 DIAGNOSIS — N6315 Unspecified lump in the right breast, overlapping quadrants: Secondary | ICD-10-CM

## 2018-06-25 ENCOUNTER — Encounter: Payer: Self-pay | Admitting: Family Medicine

## 2018-06-25 ENCOUNTER — Ambulatory Visit (INDEPENDENT_AMBULATORY_CARE_PROVIDER_SITE_OTHER): Payer: Commercial Managed Care - PPO | Admitting: Family Medicine

## 2018-06-25 VITALS — BP 124/80 | HR 91 | Temp 98.7°F | Resp 24 | Ht 66.0 in | Wt 196.0 lb

## 2018-06-25 DIAGNOSIS — R059 Cough, unspecified: Secondary | ICD-10-CM

## 2018-06-25 DIAGNOSIS — J4541 Moderate persistent asthma with (acute) exacerbation: Secondary | ICD-10-CM

## 2018-06-25 DIAGNOSIS — R05 Cough: Secondary | ICD-10-CM

## 2018-06-25 MED ORDER — IPRATROPIUM-ALBUTEROL 0.5-2.5 (3) MG/3ML IN SOLN
3.0000 mL | Freq: Four times a day (QID) | RESPIRATORY_TRACT | Status: DC
  Administered 2018-06-25: 3 mL via RESPIRATORY_TRACT

## 2018-06-25 MED ORDER — PREDNISONE 20 MG PO TABS: ORAL_TABLET | ORAL | 0 refills | Status: DC

## 2018-06-25 MED ORDER — BENZONATATE 100 MG PO CAPS: 100.0000 mg | ORAL_CAPSULE | Freq: Three times a day (TID) | ORAL | 0 refills | Status: DC | PRN

## 2018-06-25 MED ORDER — IPRATROPIUM-ALBUTEROL 0.5-2.5 (3) MG/3ML IN SOLN: 3.0000 mL | Freq: Once | RESPIRATORY_TRACT | Status: DC

## 2018-06-25 MED ORDER — MONTELUKAST SODIUM 10 MG PO TABS: 10.0000 mg | ORAL_TABLET | Freq: Every day | ORAL | 3 refills | Status: DC

## 2018-06-25 NOTE — Progress Notes (Signed)
   Subjective:    Patient ID: Danielle Mendoza, female    DOB: 06/13/1996, 22 y.o.   MRN: [id number]  HPI This is a 22 yo female who presents today with cough x 4 days. Has history of asthma and was given an albuterol inhaler last month and has been using for exercise and nightly.  Started with cough which is productive of occasional phlegm, this morning was "peachy." No fever. Mild headache, no ear pain. Hearing wheezing, feels SOB. Last used inhaler this am.  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   No past medical history on file. No past surgical history on file. No family history on file. Social History   Tobacco Use  . Smoking status: Never Smoker  . Smokeless tobacco: Never Used  Substance Use Topics  . Alcohol use: No    Alcohol/week: 0.0 standard drinks  . Drug use: No      Review of Systems Per HPI    Objective:   Physical Exam  Constitutional: She is oriented to person, place, and time. She appears well-developed and well-nourished. No distress.  HENT:  Head: Normocephalic and atraumatic.  Nose: Nose normal.  Mouth/Throat: Oropharynx is clear and moist.  Eyes: Conjunctivae are normal.  Neck: Normal range of motion. Neck supple.  Cardiovascular: Normal rate, regular rhythm and normal heart sounds.  Pulmonary/Chest: Effort normal.  Mild tachypnea and constant cough. Decreased air flow throughout. Given duo neb treatment in office with significant improvement in cough and improved airflow.   Lymphadenopathy:    She has no cervical adenopathy.  Neurological: She is alert and oriented to person, place, and time.  Skin: Skin is warm. She is diaphoretic.  Psychiatric: She has a normal mood and affect. Her behavior is normal. Judgment and thought content normal.  Vitals reviewed.     BP 124/80 (BP Location: Left Arm, Patient Position: Sitting, Cuff Size: Large)   Pulse 91   Temp 98.7 F (37.1 C) (Oral)   Resp (!) 24   Ht 5\' 6"  (1.676 m)   Wt  196 lb (88.9 kg)   LMP 05/28/2018   SpO2 99%   BMI 31.64 kg/m  Wt Readings from Last 3 Encounters:  06/25/18 196 lb (88.9 kg)  06/08/18 195 lb 8 oz (88.7 kg)  05/13/17 189 lb 6.4 oz (85.9 kg)       Assessment & Plan:  1. Moderate persistent asthma with exacerbation - Provided written and verbal information regarding diagnosis and treatment. - RTC precautions reviewed - discussed asthma and importance of control to preserve future lung function, she is to follow up with PCP for PFTs if she continues to require albuterol outside of exercise  - ipratropium-albuterol (DUONEB) 0.5-2.5 (3) MG/3ML nebulizer solution 3 mL - predniSONE (DELTASONE) 20 MG tablet; Take 3 for 3 days, 2 x 3 days, 1 x 3 days  Dispense: 18 tablet; Refill: 0 - montelukast (SINGULAIR) 10 MG tablet; Take 1 tablet (10 mg total) by mouth at bedtime.  Dispense: 90 tablet; Refill: 3  2. Cough - benzonatate (TESSALON) 100 MG capsule; Take 1-2 capsules (100-200 mg total) by mouth 3 (three) times daily as needed for cough.  Dispense: 40 capsule; Refill: 0   Olean Reeeborah Lamija Besse, FNP-BC  [facility], [facility]  06/25/2018 5:11 PM

## 2018-06-25 NOTE — Patient Instructions (Addendum)
I have sent in prescriptions for prednisone, montelukast, cough pills (benzonate)  If not better in a couple of days, please let me know   If you continue to require your albuterol inhaler more than with exercise, please schedule a follow up with Dr. Milinda Antis   Asthma, Adult Asthma is a condition of the lungs in which the airways tighten and narrow. Asthma can make it hard to breathe. Asthma cannot be cured, but medicine and lifestyle changes can help control it. Asthma may be started (triggered) by:  Animal skin flakes (dander).  Dust.  Cockroaches.  Pollen.  Mold.  Smoke.  Cleaning products.  Hair sprays or aerosol sprays.  Paint fumes or strong smells.  Cold air, weather changes, and winds.  Crying or laughing hard.  Stress.  Certain medicines or drugs.  Foods, such as dried fruit, potato chips, and sparkling grape juice.  Infections or conditions (colds, flu).  Exercise.  Certain medical conditions or diseases.  Exercise or tiring activities.  Follow these instructions at home:  Take medicine as told by your doctor.  Use a peak flow meter as told by your doctor. A peak flow meter is a tool that measures how well the lungs are working.  Record and keep track of the peak flow meter's readings.  Understand and use the asthma action plan. An asthma action plan is a written plan for taking care of your asthma and treating your attacks.  To help prevent asthma attacks: ? Do not smoke. Stay away from secondhand smoke. ? Change your heating and air conditioning filter often. ? Limit your use of fireplaces and wood stoves. ? Get rid of pests (such as roaches and mice) and their droppings. ? Throw away plants if you see mold on them. ? Clean your floors. Dust regularly. Use cleaning products that do not smell. ? Have someone vacuum when you are not home. Use a vacuum cleaner with a HEPA filter if possible. ? Replace carpet with wood, tile, or vinyl flooring.  Carpet can trap animal skin flakes and dust. ? Use allergy-proof pillows, mattress covers, and box spring covers. ? Wash bed sheets and blankets every week in hot water and dry them in a dryer. ? Use blankets that are made of polyester or cotton. ? Clean bathrooms and kitchens with bleach. If possible, have someone repaint the walls in these rooms with mold-resistant paint. Keep out of the rooms that are being cleaned and painted. ? Wash hands often. Contact a doctor if:  You have make a whistling sound when breaking (wheeze), have shortness of breath, or have a cough even if taking medicine to prevent attacks.  The colored mucus you cough up (sputum) is thicker than usual.  The colored mucus you cough up changes from clear or white to yellow, green, gray, or bloody.  You have problems from the medicine you are taking such as: ? A rash. ? Itching. ? Swelling. ? Trouble breathing.  You need reliever medicines more than 2-3 times a week.  Your peak flow measurement is still at 50-79% of your personal best after following the action plan for 1 hour.  You have a fever. Get help right away if:  You seem to be worse and are not responding to medicine during an asthma attack.  You are short of breath even at rest.  You get short of breath when doing very little activity.  You have trouble eating, drinking, or talking.  You have chest pain.  You have  a fast heartbeat.  Your lips or fingernails start to turn blue.  You are light-headed, dizzy, or faint.  Your peak flow is less than 50% of your personal best. This information is not intended to replace advice given to you by your health care provider. Make sure you discuss any questions you have with your health care provider. Document Released: 12/24/2007 Document Revised: 12/13/2015 Document Reviewed: 02/03/2013 Elsevier Interactive Patient Education  2017 [employer]

## 2018-10-08 ENCOUNTER — Other Ambulatory Visit: Payer: Self-pay | Admitting: Family Medicine

## 2018-10-08 ENCOUNTER — Telehealth: Payer: Self-pay

## 2018-10-08 ENCOUNTER — Ambulatory Visit: Payer: Commercial Managed Care - PPO | Admitting: Family Medicine

## 2018-10-08 DIAGNOSIS — R6889 Other general symptoms and signs: Secondary | ICD-10-CM

## 2018-10-08 NOTE — Progress Notes (Signed)
Error

## 2018-10-08 NOTE — Telephone Encounter (Signed)
Pt deemed high Risk for COVID19 .. sent for testing. Order placed.  Please contact pt on Monday to get update on symptoms and status. Determine if in any respiratory distress.

## 2018-10-08 NOTE — Telephone Encounter (Signed)
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 10/07/18 pt flew to [facility] and spent one night there before returning home.last wk was in [facility]. [place] or [place]. Pt does not travel to [place].  Fatigue, body aches started on 10/04/18, then S/T and ears aching started on 10/07/18. Each day symptoms worsening; tonsils are swollen and painful to swallow.slight dry cough, No SOB. Fever range on and off 99 - 100.1; does not think she has fever now. Has hx of asthma but not bothering her now. 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 Pt has been taking Dayquil. Dr Ermalene Searing advised will send pt to tent at [address] [facility] [place] for covid testing. Pt voiced understanding.pt advised to take Tylenol round the clock,push fluids; advised pt if develops rash to call [facility]. ED precautions given with info of notifying EMS & ED of covid precautions. FYI to Dr Ermalene Searing and Lupita Leash CMA.

## 2018-10-10 DIAGNOSIS — J0100 Acute maxillary sinusitis, unspecified: Secondary | ICD-10-CM | POA: Diagnosis not present

## 2018-10-11 NOTE — Telephone Encounter (Signed)
Pt called this morning stating she needs a work note stating she is quarantined until her test results come in. She went and had Covid-19 testing Friday afternoon. She has MyChart and will get the note from there to send to her employer. Call pt at [phone] when done.

## 2018-10-11 NOTE — Telephone Encounter (Signed)
Noted.  Will await Covid results, symptomatic care.  No clear need for ER visit.

## 2018-10-11 NOTE — Telephone Encounter (Signed)
Spoke with Danielle Mendoza.  She states she is doing about the same.  Body aches are gone but she is coughing more.  She denies any respiratory distress and I did not detect any respiratory distress while speaking with patient on the telephone.

## 2018-10-11 NOTE — Telephone Encounter (Signed)
I did letter-please mail to her

## 2018-10-11 NOTE — Telephone Encounter (Signed)
[name] notified as instructed by telephone.  Patient states understanding.

## 2018-10-11 NOTE — Telephone Encounter (Signed)
Letter mailed

## 2018-10-15 LAB — NOVEL CORONAVIRUS, NAA: SARS-CoV-2, NAA: NOT DETECTED

## 2018-10-18 ENCOUNTER — Telehealth: Payer: Self-pay | Admitting: Family Medicine

## 2018-10-18 NOTE — Telephone Encounter (Signed)
Pt said she just saw the copy of the letter on mychart so she has sent her job that copy. I did advise pt if they need a copy of the letter whit Dr. Royden Purl signature then just let us know and we can fax it or she can pick it up if needed.

## 2018-10-18 NOTE — Telephone Encounter (Signed)
Letter done and in IN box  She can return to work  [facility] she is feeling better

## 2018-10-18 NOTE — Telephone Encounter (Signed)
Best number [phone] Pt called stating she was tested for covid19 and the results was neg.   Test done 10/08/2018 Pt needs work note stating she can go back to work

## 2018-12-08 ENCOUNTER — Telehealth: Payer: Commercial Managed Care - PPO | Admitting: Family

## 2018-12-08 DIAGNOSIS — B373 Candidiasis of vulva and vagina: Secondary | ICD-10-CM | POA: Diagnosis not present

## 2018-12-08 DIAGNOSIS — B3731 Acute candidiasis of vulva and vagina: Secondary | ICD-10-CM

## 2018-12-08 MED ORDER — FLUCONAZOLE 150 MG PO TABS: 150.0000 mg | ORAL_TABLET | ORAL | 0 refills | Status: DC | PRN

## 2018-12-08 NOTE — Progress Notes (Signed)
We are sorry that you are not feeling well. Here is how we plan to help! Based on what you shared with me it looks like you: May have a yeast vaginosis  Vaginosis is an inflammation of the vagina that can result in discharge, itching and pain. The cause is usually a change in the normal balance of vaginal bacteria or an infection. Vaginosis can also result from reduced estrogen levels after menopause.  Approximately 5 minutes was spent documenting and reviewing patient's chart.   The most common causes of vaginosis are:   Bacterial vaginosis which results from an overgrowth of one on several organisms that are normally present in your vagina.   Yeast infections which are caused by a naturally occurring fungus called candida.   Vaginal atrophy (atrophic vaginosis) which results from the thinning of the vagina from reduced estrogen levels after menopause.   Trichomoniasis which is caused by a parasite and is commonly transmitted by sexual intercourse.  Factors that increase your risk of developing vaginosis include: . Medications, such as antibiotics and steroids . Uncontrolled diabetes . Use of hygiene products such as bubble bath, vaginal spray or vaginal deodorant . Douching . Wearing damp or tight-fitting clothing . Using an intrauterine device (IUD) for birth control . Hormonal changes, such as those associated with pregnancy, birth control pills or menopause . Sexual activity . Having a sexually transmitted infection  Your treatment plan is A single Diflucan (fluconazole) 150mg tablet once.  I have electronically sent this prescription into the pharmacy that you have chosen.  Be sure to take all of the medication as directed. Stop taking any medication if you develop a rash, tongue swelling or shortness of breath. Mothers who are breast feeding should consider pumping and discarding their breast milk while on these antibiotics. However, there is no consensus that infant exposure at  these doses would be harmful.  Remember that medication creams can weaken latex condoms. .   HOME CARE:  Good hygiene may prevent some types of vaginosis from recurring and may relieve some symptoms:  . Avoid baths, hot tubs and whirlpool spas. Rinse soap from your outer genital area after a shower, and dry the area well to prevent irritation. Don't use scented or harsh soaps, such as those with deodorant or antibacterial action. . Avoid irritants. These include scented tampons and pads. . Wipe from front to back after using the toilet. Doing so avoids spreading fecal bacteria to your vagina.  Other things that may help prevent vaginosis include:  . Don't douche. Your vagina doesn't require cleansing other than normal bathing. Repetitive douching disrupts the normal organisms that reside in the vagina and can actually increase your risk of vaginal infection. Douching won't clear up a vaginal infection. . Use a latex condom. Both female and female latex condoms may help you avoid infections spread by sexual contact. . Wear cotton underwear. Also wear pantyhose with a cotton crotch. If you feel comfortable without it, skip wearing underwear to bed. Yeast thrives in moist environments Your symptoms should improve in the next day or two.  GET HELP RIGHT AWAY IF:  . You have pain in your lower abdomen ( pelvic area or over your ovaries) . You develop nausea or vomiting . You develop a fever . Your discharge changes or worsens . You have persistent pain with intercourse . You develop shortness of breath, a rapid pulse, or you faint.  These symptoms could be signs of problems or infections that need to be   evaluated by a medical provider now.  MAKE SURE YOU    Understand these instructions.  Will watch your condition.  Will get help right away if you are not doing well or get worse.  Your e-visit answers were reviewed by a board certified advanced clinical practitioner to complete your  personal care plan. Depending upon the condition, your plan could have included both over the counter or prescription medications. Please review your pharmacy choice to make sure that you have choses a pharmacy that is open for you to pick up any needed prescription, Your safety is important to us. If you have drug allergies check your prescription carefully.   You can use MyChart to ask questions about today's visit, request a non-urgent call back, or ask for a work or school excuse for 24 hours related to this e-Visit. If it has been greater than 24 hours you will need to follow up with your provider, or enter a new e-Visit to address those concerns. You will get a MyChart message within the next two days asking about your experience. I hope that your e-visit has been valuable and will speed your recovery.  

## 2019-02-21 ENCOUNTER — Other Ambulatory Visit: Payer: Self-pay

## 2019-02-21 ENCOUNTER — Ambulatory Visit: Payer: Commercial Managed Care - PPO | Admitting: Family Medicine

## 2019-02-21 ENCOUNTER — Encounter: Payer: Self-pay | Admitting: Family Medicine

## 2019-02-21 VITALS — BP 116/78 | HR 69 | Temp 98.4°F | Ht 66.0 in | Wt 182.6 lb

## 2019-02-21 DIAGNOSIS — Z3009 Encounter for other general counseling and advice on contraception: Secondary | ICD-10-CM

## 2019-02-21 DIAGNOSIS — Z113 Encounter for screening for infections with a predominantly sexual mode of transmission: Secondary | ICD-10-CM | POA: Diagnosis not present

## 2019-02-21 NOTE — Patient Instructions (Addendum)
Use condoms  Continue oral contraceptive for now I will refer you to gyn to discuss other options   The office will call you about this

## 2019-02-21 NOTE — Progress Notes (Signed)
Subjective:    Patient ID: Danielle Mendoza, female    DOB: 1995/09/20, 23 y.o.   MRN: [id number]  HPI Pt presents interested in STD testing   Unsure if she may have been exposed  Her last partner was seeing someone else  He is being screened and so is he   He had ? Symptoms-unsure what (perhaps chlamydia but unsure)   She has no symptoms whatsoever  Had unprotected sex once     LMP 02/14/19  Taking OC aviane   Pap neg 11/19  Had neg gc/chlamydia Did have yeast on her pap   She has never had STD before   Patient Active Problem List   Diagnosis Date Noted  . General counseling and advice on contraceptive management 02/21/2019  . Breast lump on right side at 3 o'clock position 06/08/2018  . Allergy 06/18/2017  . Nasal turbinate hypertrophy 06/18/2017  . Routine general medical examination at a health care facility 11/03/2016  . Encounter for routine gynecological examination 11/03/2016  . Screen for STD (sexually transmitted disease) 07/03/2014  . Well adolescent visit 11/24/2012  . Acne 11/11/2011  . CARDIAC MURMUR 09/05/2008  . Asthma 12/07/2006   History reviewed. No pertinent past medical history. History reviewed. No pertinent surgical history. Social History   Tobacco Use  . Smoking status: Never Smoker  . Smokeless tobacco: Never Used  Substance Use Topics  . Alcohol use: No    Alcohol/week: 0.0 standard drinks  . Drug use: No   History reviewed. No pertinent family history. No Known Allergies Current Outpatient Medications on File Prior to Visit  Medication Sig Dispense Refill  . albuterol (PROVENTIL HFA;VENTOLIN HFA) 108 (90 Base) MCG/ACT inhaler Inhale 2 puffs into the lungs every 4 (four) hours as needed for wheezing or shortness of breath (cough). 1 Inhaler 11  . fluticasone (FLONASE) 50 MCG/ACT nasal spray Place 1 spray into both nostrils daily as needed for allergies or rhinitis.    Marland Kitchen levonorgestrel-ethinyl estradiol (AVIANE) 0.1-20 MG-MCG tablet  Take 1 tablet by mouth daily. 84 tablet 3   No current facility-administered medications on file prior to visit.     Review of Systems  Constitutional: Negative for activity change, appetite change, fatigue, fever and unexpected weight change.  HENT: Negative for congestion, ear pain, rhinorrhea, sinus pressure and sore throat.   Eyes: Negative for pain, redness and visual disturbance.  Respiratory: Negative for cough, shortness of breath and wheezing.   Cardiovascular: Negative for chest pain and palpitations.  Gastrointestinal: Negative for abdominal pain, blood in stool, constipation and diarrhea.  Endocrine: Negative for polydipsia and polyuria.  Genitourinary: Negative for dyspareunia, dysuria, frequency, genital sores, menstrual problem, pelvic pain, urgency, vaginal bleeding, vaginal discharge and vaginal pain.  Musculoskeletal: Negative for arthralgias, back pain and myalgias.  Skin: Negative for pallor and rash.  Allergic/Immunologic: Negative for environmental allergies.  Neurological: Negative for dizziness, syncope and headaches.  Hematological: Negative for adenopathy. Does not bruise/bleed easily.  Psychiatric/Behavioral: Negative for decreased concentration and dysphoric mood. The patient is not nervous/anxious.        Objective:   Physical Exam Constitutional:      Appearance: Normal appearance. She is obese. She is not ill-appearing.  HENT:     Head: Atraumatic.     Mouth/Throat:     Pharynx: Oropharynx is clear. No posterior oropharyngeal erythema.  Eyes:     General:        Right eye: No discharge.  Left eye: No discharge.     Extraocular Movements: Extraocular movements intact.     Pupils: Pupils are equal, round, and reactive to light.  Neck:     Musculoskeletal: Normal range of motion and neck supple.  Cardiovascular:     Rate and Rhythm: Normal rate and regular rhythm.     Pulses: Normal pulses.  Pulmonary:     Effort: Pulmonary effort is normal.  No respiratory distress.     Breath sounds: Normal breath sounds. No wheezing or rales.  Abdominal:     General: Abdomen is flat. There is no distension.     Palpations: Abdomen is soft.     Tenderness: There is no abdominal tenderness. There is no right CVA tenderness or left CVA tenderness.     Comments: No suprapubic tenderness or fullness  No cva tenderness   Lymphadenopathy:     Cervical: No cervical adenopathy.  Skin:    Findings: No erythema or rash.  Neurological:     Mental Status: She is alert.  Psychiatric:        Mood and Affect: Mood normal.           Assessment & Plan:   Problem List Items Addressed This Visit      Other   Screen for STD (sexually transmitted disease) - Primary    Possible exposure but no symptoms  Gc/chlamydia today  Also HIV /RPR No report of lesions or pain  No report of d/c  Disc importance of condom use unless entirely monogamous       Relevant Orders   HIV Antibody (routine testing w rflx)   RPR   C. trachomatis/N. gonorrhoeae RNA   General counseling and advice on contraceptive management    Pt has a hard time taking her OC on time when traveling She would like to consider implant/ IUD or other options Ref to obgyn to discuss      Relevant Orders   Ambulatory referral to Obstetrics / Gynecology   C. trachomatis/N. gonorrhoeae RNA

## 2019-02-21 NOTE — Assessment & Plan Note (Signed)
Possible exposure but no symptoms  Gc/chlamydia today  Also HIV /RPR No report of lesions or pain  No report of d/c  Disc importance of condom use unless entirely monogamous

## 2019-02-21 NOTE — Assessment & Plan Note (Signed)
Pt has a hard time taking her OC on time when traveling She would like to consider implant/ IUD or other options Ref to obgyn to discuss

## 2019-02-22 ENCOUNTER — Telehealth: Payer: Self-pay | Admitting: *Deleted

## 2019-02-22 LAB — C. TRACHOMATIS/N. GONORRHOEAE RNA
C. trachomatis RNA, TMA: DETECTED — AB
N. gonorrhoeae RNA, TMA: NOT DETECTED

## 2019-02-22 LAB — RPR: RPR Ser Ql: NONREACTIVE

## 2019-02-22 LAB — HIV ANTIBODY (ROUTINE TESTING W REFLEX): HIV 1&2 Ab, 4th Generation: NONREACTIVE

## 2019-02-22 MED ORDER — AZITHROMYCIN 500 MG PO TABS: 1000.0000 mg | ORAL_TABLET | Freq: Once | ORAL | 0 refills | Status: AC

## 2019-02-22 NOTE — Telephone Encounter (Signed)
-----   Message from Abner Greenspan, MD sent at 02/22/2019  8:16 AM EDT ----- Released on mychart Chlamydia positive Please send in azithromycin 500 mg  , 2 pills by mouth once #2 no refills   We will let her know when rest of the tests return (HIV is still pending)

## 2019-02-22 NOTE — Telephone Encounter (Signed)
Rx sent to pharmacy and pt aware. She also viewed results on mychart.

## 2019-09-23 ENCOUNTER — Ambulatory Visit: Payer: Commercial Managed Care - PPO | Attending: Internal Medicine

## 2019-09-23 DIAGNOSIS — Z23 Encounter for immunization: Secondary | ICD-10-CM | POA: Insufficient documentation

## 2019-09-23 NOTE — Progress Notes (Signed)
   Covid-19 Vaccination Clinic  Name:  Danielle Mendoza    MRN: [id number] DOB: [birth year]  09/23/2019  [name] was observed post Covid-19 immunization for 15 minutes without incident. She was provided with Vaccine Information Sheet and instruction to access the V-Safe system.   [name] was instructed to call 911 with any severe reactions post vaccine: Marland Kitchen Difficulty breathing  . Swelling of face and throat  . A fast heartbeat  . A bad rash all over body  . Dizziness and weakness

## 2019-09-29 ENCOUNTER — Ambulatory Visit: Payer: Commercial Managed Care - PPO

## 2019-11-01 ENCOUNTER — Ambulatory Visit: Payer: Commercial Managed Care - PPO | Attending: Internal Medicine

## 2019-11-01 DIAGNOSIS — Z23 Encounter for immunization: Secondary | ICD-10-CM

## 2019-11-01 NOTE — Progress Notes (Signed)
   Covid-19 Vaccination Clinic  Name:  Danielle Mendoza    MRN: [id number] DOB: [birth year]  11/01/2019  Danielle Mendoza was observed post Covid-19 immunization for 15 minutes without incident. She was provided with Vaccine Information Sheet and instruction to access the V-Safe system.   Danielle Mendoza was instructed to call 911 with any severe reactions post vaccine: Marland Kitchen Difficulty breathing  . Swelling of face and throat  . A fast heartbeat  . A bad rash all over body  . Dizziness and weakness   Immunizations Administered    Name Date Dose VIS Date Route   Moderna COVID-19 Vaccine 11/01/2019  9:08 AM 0.5 mL 06/21/2019 Intramuscular   Manufacturer: Moderna   Lot: [id number]   NDC: 34373-578-97

## 2019-12-14 IMAGING — US ULTRASOUND LEFT BREAST LIMITED
1 series · 4 of 4 positions shown · non-contrast
Comparison: None.

CLINICAL DATA: 22-year-old female with a palpable abnormality in
the outer left breast felt by her physician.

EXAM:
ULTRASOUND OF THE LEFT BREAST

[Series 1: ultrasound left breast limited · 0.06mm/px · 4 of 4 slices shown]
[im 1/4]
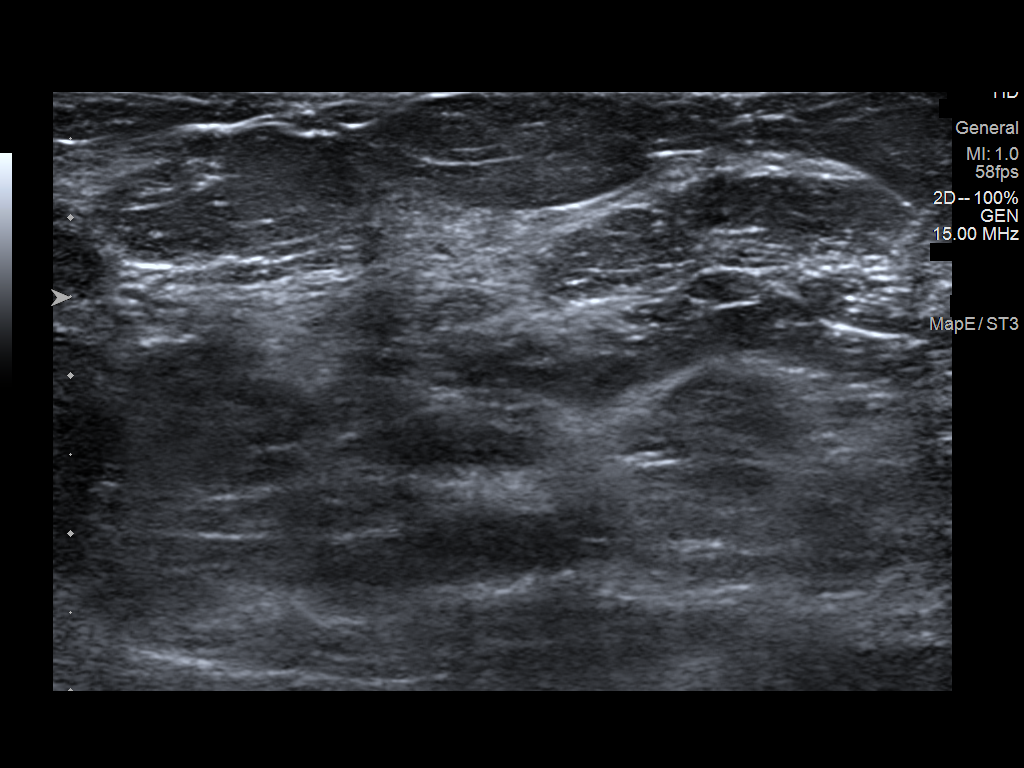
[im 2/4]
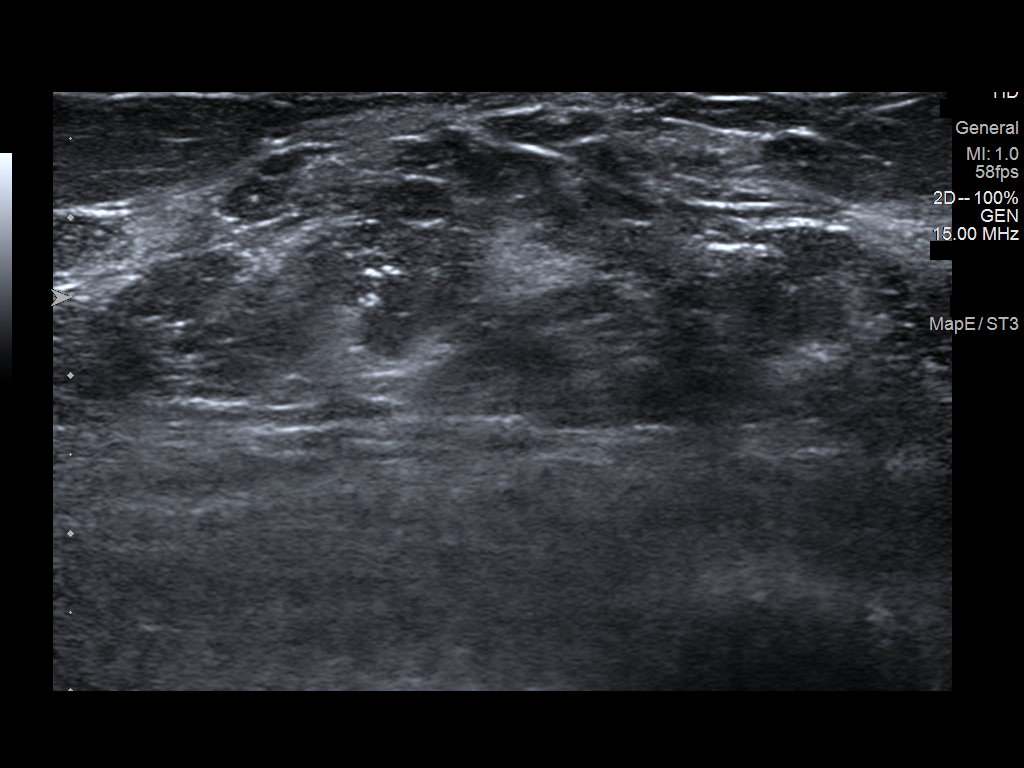
[im 3/4]
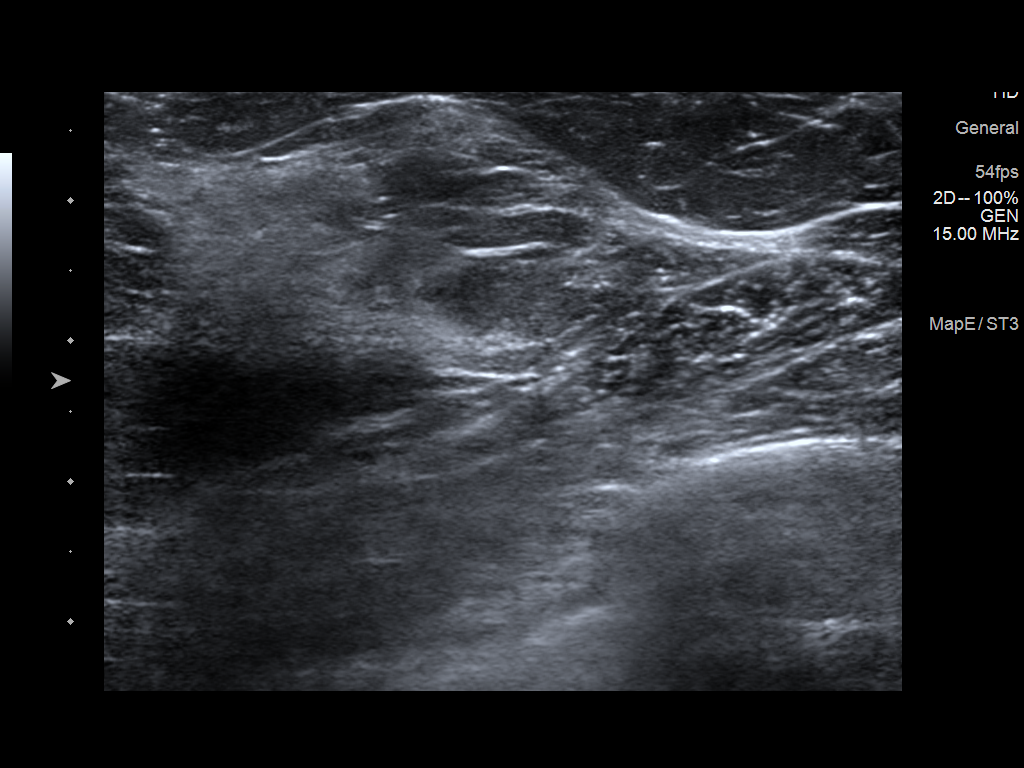
[im 4/4]
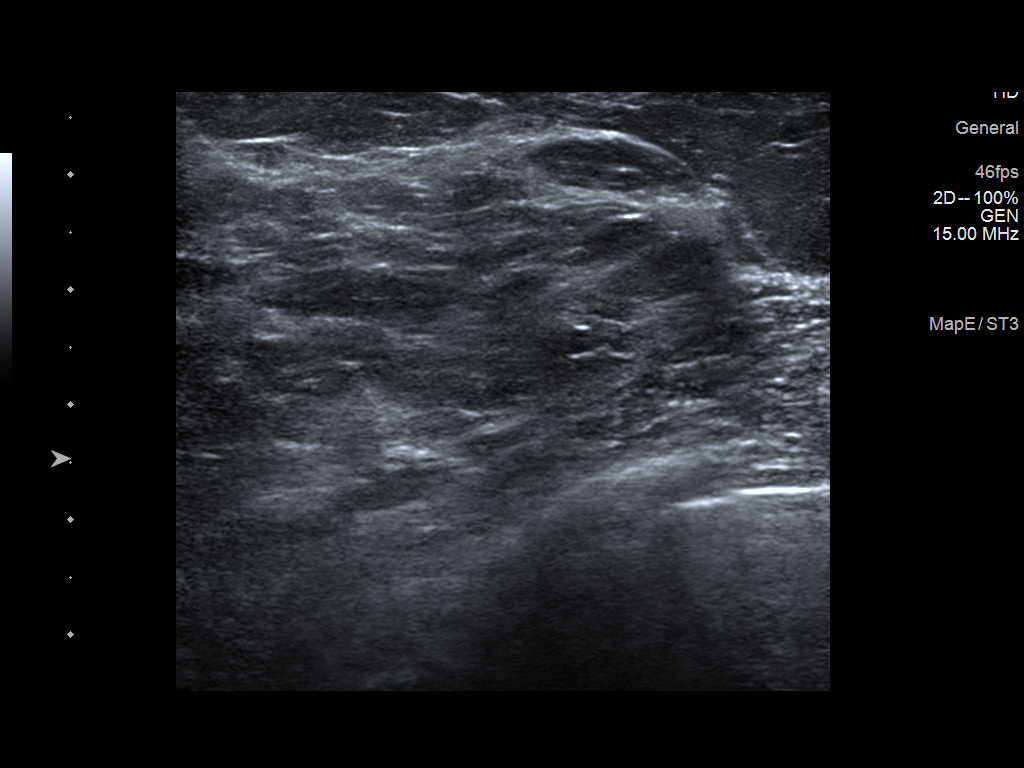

[4 of 4 positions shown; findings below may reference images not displayed]

FINDINGS: Physical examination of the outer left breast does not reveal any
discrete palpable masses.

Targeted ultrasound of the outer left breast was performed. No
suspicious masses or abnormality seen, only heterogeneous
fibroglandular tissue.
IMPRESSION: No abnormalities identified at site of palpable concern in the outer
left breast. This palpable abnormality may be due to heterogeneous
fibroglandular tissue in this location.

RECOMMENDATION:
1. Recommend further management of the left breast palpable
abnormality be based on clinical assessment.

2. Screening mammogram at age 39 unless there are persistent or
intervening clinical concerns. (Code:[id number]-[id number])

I have discussed the findings and recommendations with the patient.
Results were also provided in writing at the conclusion of the
visit. If applicable, a reminder letter will be sent to the patient
regarding the next appointment.

BI-RADS CATEGORY  1: Negative.

## 2019-12-21 ENCOUNTER — Telehealth: Payer: Commercial Managed Care - PPO | Admitting: Nurse Practitioner

## 2019-12-21 DIAGNOSIS — J039 Acute tonsillitis, unspecified: Secondary | ICD-10-CM

## 2019-12-21 DIAGNOSIS — J020 Streptococcal pharyngitis: Secondary | ICD-10-CM | POA: Diagnosis not present

## 2019-12-21 MED ORDER — AMOXICILLIN 500 MG PO CAPS: 500.0000 mg | ORAL_CAPSULE | Freq: Two times a day (BID) | ORAL | 0 refills | Status: DC

## 2019-12-21 NOTE — Progress Notes (Signed)
We are sorry that you are not feeling well.  Here is how we plan to help!  Based on what you have shared with me it is likely that you have strep pharyngitis or excudative tonillitis.  Strep pharyngitis/excudative tonilliti is inflammation and infection in the back of the throat.  This is an infection cause by bacteria and is treated with antibiotics.  I have prescribed Amoxicillin 500 mg twice a day for 10 days. For throat pain, we recommend over the counter oral pain relief medications such as acetaminophen or aspirin, or anti-inflammatory medications such as ibuprofen or naproxen sodium. Topical treatments such as oral throat lozenges or sprays may be used as needed. Strep infections are not as easily transmitted as other respiratory infections, however we still recommend that you avoid close contact with loved ones, especially the very young and elderly.  Remember to wash your hands thoroughly throughout the day as this is the number one way to prevent the spread of infection and wipe down door knobs and counters with disinfectant.   Home Care:  Only take medications as instructed by your medical team.  Complete the entire course of an antibiotic.  Do not take these medications with alcohol.  A steam or ultrasonic humidifier can help congestion.  You can place a towel over your head and breathe in the steam from hot water coming from a faucet.  Avoid close contacts especially the very young and the elderly.  Cover your mouth when you cough or sneeze.  Always remember to wash your hands.  Get Help Right Away If:  You develop worsening fever or sinus pain.  You develop a severe head ache or visual changes.  Your symptoms persist after you have completed your treatment plan.  Make sure you  Understand these instructions.  Will watch your condition.  Will get help right away if you are not doing well or get worse.  Your e-visit answers were reviewed by a board certified advanced  clinical practitioner to complete your personal care plan.  Depending on the condition, your plan could have included both over the counter or prescription medications.  If there is a problem please reply  once you have received a response from your provider.  Your safety is important to [place].  If you have drug allergies check your prescription carefully.    You can use MyChart to ask questions about today's visit, request a non-urgent call back, or ask for a work or school excuse for 24 hours related to this e-Visit. If it has been greater than 24 hours you will need to follow up with your provider, or enter a new e-Visit to address those concerns.  You will get an e-mail in the next two days asking about your experience.  I hope that your e-visit has been valuable and will speed your recovery. Thank you for using e-visits.  5-10 minutes spent reviewing and documenting in chart.

## 2020-02-17 ENCOUNTER — Telehealth: Payer: Commercial Managed Care - PPO | Admitting: Nurse Practitioner

## 2020-02-17 DIAGNOSIS — R070 Pain in throat: Secondary | ICD-10-CM

## 2020-02-17 DIAGNOSIS — M436 Torticollis: Secondary | ICD-10-CM

## 2020-02-17 NOTE — Progress Notes (Signed)
Based on what you shared with me, I feel your condition warrants further evaluation and I recommend that you be seen for a face to face office visit. I am recommending a face to face visit at this time due to your "neck stiffness".  I see that you were treated for bacterial pharyngitis recently, which is also a concern due to the reoccurrence of symptoms.   NOTE: If you entered your credit card information for this eVisit, you will not be charged. You may see a "hold" on your card for the $35 but that hold will drop off and you will not have a charge processed.   If you are having a true medical emergency please call 911.      For an urgent face to face visit, [place] has five urgent care centers for your convenience:      NEW:  [facility] at [facility] Directions [phone] [address] [place], [place] [address] . 10 am - 6pm Monday - Friday    [facility]) Get Driving Directions [phone] [address] [place], [place] [address] . 10 am to 8 pm Monday-Friday . 12 pm to 8 pm [facility] at [facility] Get Driving Directions [phone] [address], [address] [place], [place] [address] . 8 am to 8 pm Monday-Friday . 9 am to 6 pm Saturday . 11 am to 6 pm Sunday     [facility] at [facility] Get Driving Directions  [phone] [address] [place], [place] [address] . 8 am to 8 pm Monday-Friday . 8 am to 4 pm [facility] Urgent Care at [facility] Directions [phone] [address] [place], [place] [address] . 12 pm to 6 pm Monday-Friday      Your e-visit answers were reviewed by a board certified advanced clinical practitioner to complete your personal care plan.  Thank you for using e-Visits.   I have spent at least 5 minutes reviewing and documenting in the  patient's chart.

## 2020-04-25 ENCOUNTER — Ambulatory Visit: Payer: Commercial Managed Care - PPO | Admitting: Family Medicine

## 2020-04-26 ENCOUNTER — Encounter: Payer: Self-pay | Admitting: Family Medicine

## 2020-04-26 ENCOUNTER — Ambulatory Visit: Payer: Commercial Managed Care - PPO | Admitting: Family Medicine

## 2020-04-26 ENCOUNTER — Other Ambulatory Visit: Payer: Self-pay

## 2020-04-26 VITALS — BP 110/72 | HR 80 | Temp 97.0°F | Ht 66.0 in | Wt 188.0 lb

## 2020-04-26 DIAGNOSIS — Z23 Encounter for immunization: Secondary | ICD-10-CM

## 2020-04-26 DIAGNOSIS — Z113 Encounter for screening for infections with a predominantly sexual mode of transmission: Secondary | ICD-10-CM | POA: Diagnosis not present

## 2020-04-26 DIAGNOSIS — J4520 Mild intermittent asthma, uncomplicated: Secondary | ICD-10-CM | POA: Diagnosis not present

## 2020-04-26 MED ORDER — ALBUTEROL SULFATE HFA 108 (90 BASE) MCG/ACT IN AERS: 2.0000 | INHALATION_SPRAY | RESPIRATORY_TRACT | 5 refills | Status: DC | PRN

## 2020-04-26 NOTE — Progress Notes (Signed)
Subjective:    Patient ID: Danielle Mendoza, female    DOB: Apr 08, 1996, 24 y.o.   MRN: [id number]  This visit occurred during the SARS-CoV-2 public health emergency.  Safety protocols were in place, including screening questions prior to the visit, additional usage of staff PPE, and extensive cleaning of exam room while observing appropriate contact time as indicated for disinfecting solutions.    HPI Pt presents for STD screen and medication refill   Wt Readings from Last 3 Encounters:  04/26/20 188 lb (85.3 kg)  02/21/19 182 lb 9 oz (82.8 kg)  06/25/18 196 lb (88.9 kg)   30.34 kg/m   Has been a while since STD screening  No symptoms  No vaginal d/c or burning or pain  Uses condoms 90% of the time  Had several partners a few months   She plans on having current partner get checked   Had moderna vaccines for covid   Last pap was 11/19   She had chlamydia 8/20 -treated with azithromycin   OC aviane in the past for contraception  Now mirena iud - Dr Henderson Cloud  She does have periods still (a little worse-crampy)   Asthma is worse this time of year (cough wakes her up with wheezing)  Mostly at night  Worse if she has acid reflux  Not severe    Patient Active Problem List   Diagnosis Date Noted  . General counseling and advice on contraceptive management 02/21/2019  . Breast lump on right side at 3 o'clock position 06/08/2018  . Allergy 06/18/2017  . Nasal turbinate hypertrophy 06/18/2017  . Routine general medical examination at a health care facility 11/03/2016  . Encounter for routine gynecological examination 11/03/2016  . Screen for STD (sexually transmitted disease) 07/03/2014  . Well adolescent visit 11/24/2012  . Acne 11/11/2011  . CARDIAC MURMUR 09/05/2008  . Asthma 12/07/2006   History reviewed. No pertinent past medical history. History reviewed. No pertinent surgical history. Social History   Tobacco Use  . Smoking status: Never Smoker  . Smokeless  tobacco: Never Used  Substance Use Topics  . Alcohol use: No    Alcohol/week: 0.0 standard drinks  . Drug use: No   History reviewed. No pertinent family history. No Known Allergies Current Outpatient Medications on File Prior to Visit  Medication Sig Dispense Refill  . fluticasone (FLONASE ALLERGY RELIEF) 50 MCG/ACT nasal spray Flonase Allergy Relief    . influenza vac split quadrivalent PF (FLUZONE QUADRIVALENT) 0.5 ML injection Fluzone Quad 2020-2021 (PF) 60 mcg (15 mcg x 4)/0.5 mL IM syringe  PHARMACY ADMINISTERED    . levonorgestrel (MIRENA, 52 MG,) 20 MCG/24HR IUD Mirena 20 mcg/24 hours (6 yrs) 52 mg intrauterine device  Take 1 device by intrauterine route.    . fluticasone (FLONASE) 50 MCG/ACT nasal spray Place 1 spray into both nostrils daily as needed for allergies or rhinitis. (Patient not taking: Reported on 04/26/2020)     No current facility-administered medications on file prior to visit.    Review of Systems  Constitutional: Negative for activity change, appetite change, fatigue, fever and unexpected weight change.  HENT: Negative for congestion, ear pain, rhinorrhea, sinus pressure and sore throat.   Eyes: Negative for pain, redness and visual disturbance.  Respiratory: Positive for cough and wheezing. Negative for shortness of breath.        From asthma  Wakes her up at night   Cardiovascular: Negative for chest pain and palpitations.  Gastrointestinal: Negative for abdominal pain, blood  in stool, constipation and diarrhea.       Occ heartburn /indigestion   Endocrine: Negative for polydipsia and polyuria.  Genitourinary: Negative for dysuria, frequency and urgency.  Musculoskeletal: Negative for arthralgias, back pain and myalgias.  Skin: Negative for pallor and rash.  Allergic/Immunologic: Negative for environmental allergies.  Neurological: Negative for dizziness, syncope and headaches.  Hematological: Negative for adenopathy. Does not bruise/bleed easily.    Psychiatric/Behavioral: Negative for decreased concentration and dysphoric mood. The patient is not nervous/anxious.         Objective:   Physical Exam Constitutional:      General: She is not in acute distress.    Appearance: Normal appearance. She is well-developed. She is obese. She is not ill-appearing.  HENT:     Head: Normocephalic and atraumatic.  Eyes:     General: No scleral icterus.    Conjunctiva/sclera: Conjunctivae normal.     Pupils: Pupils are equal, round, and reactive to light.  Neck:     Thyroid: No thyromegaly.     Vascular: No carotid bruit or JVD.  Cardiovascular:     Rate and Rhythm: Normal rate and regular rhythm.     Heart sounds: No gallop.   Pulmonary:     Effort: Pulmonary effort is normal. No respiratory distress.     Breath sounds: Normal breath sounds. No stridor. No wheezing, rhonchi or rales.  Abdominal:     General: Bowel sounds are normal. There is no distension or abdominal bruit.     Palpations: Abdomen is soft. There is no mass.     Tenderness: There is no abdominal tenderness.  Musculoskeletal:     Cervical back: Normal range of motion and neck supple.     Left lower leg: No edema.  Lymphadenopathy:     Cervical: No cervical adenopathy.  Skin:    General: Skin is warm and dry.     Findings: No rash.  Neurological:     Mental Status: She is alert.     Deep Tendon Reflexes: Reflexes are normal and symmetric.  Psychiatric:        Mood and Affect: Mood normal.           Assessment & Plan:   Problem List Items Addressed This Visit      Respiratory   Asthma    Tends to worsen in the fall and at night  occ wakes up coughing  Discussed watching out for symptoms of GERD and adjusting diet and bedtime  If worse, would consider preventative tx  inst to update  Given flu shot today      Relevant Medications   albuterol (VENTOLIN HFA) 108 (90 Base) MCG/ACT inhaler     Other   Screen for STD (sexually transmitted disease) -  Primary    Pt req STD tests  No symptoms  Had chlamydia in the past-was treated  She plans to have her partner treated also  Gc/chlam, rpr, HIV, hep C tests today      Relevant Orders   Hepatitis C antibody   HIV Antibody (routine testing w rflx)   RPR   C. trachomatis/N. gonorrhoeae RNA    Other Visit Diagnoses    Need for influenza vaccination       Relevant Orders   Flu Vaccine QUAD 6+ mos PF IM (Fluarix Quad PF) (Completed)

## 2020-04-26 NOTE — Assessment & Plan Note (Addendum)
Tends to worsen in the fall and at night  occ wakes up coughing  Discussed watching out for symptoms of GERD and adjusting diet and bedtime  If worse, would consider preventative tx  inst to update  Given flu shot today

## 2020-04-26 NOTE — Patient Instructions (Addendum)
STD screening today  Be safe and use condoms   I sent in albuterol inhaler  Treat heartburn or avoid the foods that cause it   (pepid, antacids)   If asthma gets worse please let us know

## 2020-04-26 NOTE — Assessment & Plan Note (Signed)
Pt req STD tests  No symptoms  Had chlamydia in the past-was treated  She plans to have her partner treated also  Gc/chlam, rpr, HIV, hep C tests today

## 2020-04-30 LAB — C. TRACHOMATIS/N. GONORRHOEAE RNA
C. trachomatis RNA, TMA: NOT DETECTED
N. gonorrhoeae RNA, TMA: NOT DETECTED

## 2020-04-30 LAB — RPR: RPR Ser Ql: NONREACTIVE

## 2020-04-30 LAB — HEPATITIS C ANTIBODY
Hepatitis C Ab: NONREACTIVE
SIGNAL TO CUT-OFF: 0.01 (ref <1.00)

## 2020-04-30 LAB — HIV ANTIBODY (ROUTINE TESTING W REFLEX): HIV 1&2 Ab, 4th Generation: NONREACTIVE

## 2020-05-09 ENCOUNTER — Encounter: Payer: Self-pay | Admitting: Family Medicine

## 2020-05-09 MED ORDER — FLOVENT HFA 44 MCG/ACT IN AERO: 2.0000 | INHALATION_SPRAY | Freq: Two times a day (BID) | RESPIRATORY_TRACT | 11 refills | Status: DC

## 2020-12-21 ENCOUNTER — Encounter: Payer: Self-pay | Admitting: Family Medicine

## 2020-12-21 ENCOUNTER — Telehealth (INDEPENDENT_AMBULATORY_CARE_PROVIDER_SITE_OTHER): Payer: Commercial Managed Care - PPO | Admitting: Family Medicine

## 2020-12-21 DIAGNOSIS — J069 Acute upper respiratory infection, unspecified: Secondary | ICD-10-CM | POA: Diagnosis not present

## 2020-12-21 DIAGNOSIS — M542 Cervicalgia: Secondary | ICD-10-CM

## 2020-12-21 NOTE — Progress Notes (Signed)
Virtual Visit via Video Note  I connected with Danielle Mendoza on 12/21/20 at  8:30 AM EDT by a video enabled telemedicine application 2/2 COVID-19 pandemic and verified that I am speaking with the correct person using two identifiers.  Location patient: home Location provider:work or home office Persons participating in the virtual visit: patient, provider  I discussed the limitations of evaluation and management by telemedicine and the availability of in person appointments. The patient expressed understanding and agreed to proceed.   HPI: Pt with posterior neck pain since Monday.  Pt with fatigue, elevated temp 99.5 F.  Pt had a brief HA, but notes a h/o HAs at baseline.  Endorses rhinorrhea.  Denies sore throat, cough, n/v, diarrhea.  Tried Tylenol.  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  Pt endorses being around her family last wk who were sick.  Pt had a negative at home COVID test yesterday.   ROS: See pertinent positives and negatives per HPI.  No past medical history on file.  No past surgical history on file.  No family history on file.   Current Outpatient Medications:  .  albuterol (VENTOLIN HFA) 108 (90 Base) MCG/ACT inhaler, Inhale 2 puffs into the lungs every 4 (four) hours as needed for wheezing or shortness of breath (cough)., Disp: 1 each, Rfl: 5 .  fluticasone (FLONASE) 50 MCG/ACT nasal spray, Flonase Allergy Relief, Disp: , Rfl:  .  fluticasone (FLOVENT HFA) 44 MCG/ACT inhaler, Inhale 2 puffs into the lungs in the morning and at bedtime., Disp: 1 each, Rfl: 11 .  influenza vac split quadrivalent PF (FLUZONE QUADRIVALENT) 0.5 ML injection, Fluzone Quad 2020-2021 (PF) 60 mcg (15 mcg x 4)/0.5 mL IM syringe  PHARMACY ADMINISTERED, Disp: , Rfl:  .  levonorgestrel (MIRENA, 52 MG,) 20 MCG/24HR IUD, Mirena 20 mcg/24 hours (6 yrs) 52 mg intrauterine device  Take 1 device by intrauterine route., Disp: , Rfl:  .  fluticasone (FLONASE) 50 MCG/ACT  nasal spray, Place 1 spray into both nostrils daily as needed for allergies or rhinitis. (Patient not taking: No sig reported), Disp: , Rfl:   EXAM:  VITALS per patient if applicable:RR between 12-20 bpm  GENERAL: alert, oriented, appears well and in no acute distress  HEENT: atraumatic, conjunctiva clear, no obvious abnormalities on inspection of external nose and ears  NECK: normal movements of the head and neck  LUNGS: on inspection no signs of respiratory distress, breathing rate appears normal, no obvious gross SOB, gasping or wheezing  CV: no obvious cyanosis  MS: Neck discomfort with flexion of cervical spine.  No discomfort with rapid lateral movements of head.   Moves all visible extremities without noticeable abnormality  PSYCH/NEURO: pleasant and cooperative, no obvious depression or anxiety, speech and thought processing grossly intact  ASSESSMENT AND PLAN:  Discussed the following assessment and plan:  Viral URI -likely acute nasopharyngitis.  Also consider AOM -at home COVID testing 12/20/2020 negative.  Consider repeat testing -Discussed treatment of symptoms including rest, hydration, Tylenol/NSAIDs, OTC medications -Continue quarantine -Given strict precautionsfor continued or worsened symptoms.  Neck pain -likely 2/2 viral URI.  Also consider meningitis though does not have severe HA, fever, nausea, AMS -discussed treatment of symptoms with heat, ice, massage, Tylenol or NSAIDs -Given strict precautions for worsening symptoms  F/u prn with PCP   I discussed the assessment and treatment plan with the patient. The patient was provided an opportunity to ask questions and all were answered.  The patient agreed with the plan and demonstrated an understanding of the instructions.   The patient was advised to call back or seek an in-person evaluation if the symptoms worsen or if the condition fails to improve as anticipated.   Deeann Saint, MD

## 2021-01-30 ENCOUNTER — Telehealth: Payer: Commercial Managed Care - PPO | Admitting: Physician Assistant

## 2021-01-30 DIAGNOSIS — J028 Acute pharyngitis due to other specified organisms: Secondary | ICD-10-CM

## 2021-01-30 DIAGNOSIS — B9689 Other specified bacterial agents as the cause of diseases classified elsewhere: Secondary | ICD-10-CM

## 2021-01-30 MED ORDER — AMOXICILLIN 500 MG PO TABS: 500.0000 mg | ORAL_TABLET | Freq: Two times a day (BID) | ORAL | 0 refills | Status: DC

## 2021-01-30 NOTE — Progress Notes (Signed)

## 2021-01-30 NOTE — Progress Notes (Signed)
I have spent 5 minutes in review of e-visit questionnaire, review and updating patient chart, medical decision making and response to patient.   Uva Runkel Cody Ahsan Esterline, PA-C    

## 2021-10-23 ENCOUNTER — Telehealth: Payer: Commercial Managed Care - PPO | Admitting: Nurse Practitioner

## 2021-10-23 DIAGNOSIS — J028 Acute pharyngitis due to other specified organisms: Secondary | ICD-10-CM | POA: Diagnosis not present

## 2021-10-23 DIAGNOSIS — B9689 Other specified bacterial agents as the cause of diseases classified elsewhere: Secondary | ICD-10-CM | POA: Diagnosis not present

## 2021-10-23 MED ORDER — AMOXICILLIN 500 MG PO TABS: 500.0000 mg | ORAL_TABLET | Freq: Two times a day (BID) | ORAL | 0 refills | Status: DC

## 2021-10-23 NOTE — Progress Notes (Signed)
E-Visit for Sore Throat - Strep Symptoms ° °We are sorry that you are not feeling well.  Here is how we plan to help! ° °Based on what you have shared with me it is likely that you have strep pharyngitis.  Strep pharyngitis is inflammation and infection in the back of the throat.  This is an infection cause by bacteria and is treated with antibiotics.  I have prescribed Amoxicillin 500 mg twice a day for 10 days. For throat pain, we recommend over the counter oral pain relief medications such as acetaminophen or aspirin, or anti-inflammatory medications such as ibuprofen or naproxen sodium. Topical treatments such as oral throat lozenges or sprays may be used as needed. Strep infections are not as easily transmitted as other respiratory infections, however we still recommend that you avoid close contact with loved ones, especially the very young and elderly.  Remember to wash your hands thoroughly throughout the day as this is the number one way to prevent the spread of infection and wipe down door knobs and counters with disinfectant. ° ° °Home Care: °Only take medications as instructed by your medical team. °Complete the entire course of an antibiotic. °Do not take these medications with alcohol. °A steam or ultrasonic humidifier can help congestion.  You can place a towel over your head and breathe in the steam from hot water coming from a faucet. °Avoid close contacts especially the very young and the elderly. °Cover your mouth when you cough or sneeze. °Always remember to wash your hands. ° °Get Help Right Away If: °You develop worsening fever or sinus pain. °You develop a severe head ache or visual changes. °Your symptoms persist after you have completed your treatment plan. ° °Make sure you °Understand these instructions. °Will watch your condition. °Will get help right away if you are not doing well or get worse. ° ° °Thank you for choosing an e-visit. ° °Your e-visit answers were reviewed by a board  certified advanced clinical practitioner to complete your personal care plan. Depending upon the condition, your plan could have included both over the counter or prescription medications. ° °Please review your pharmacy choice. Make sure the pharmacy is open so you can pick up prescription now. If there is a problem, you may contact your provider through MyChart messaging and have the prescription routed to another pharmacy.  Your safety is important to us. If you have drug allergies check your prescription carefully.  ° °For the next 24 hours you can use MyChart to ask questions about today's visit, request a non-urgent call back, or ask for a work or school excuse. °You will get an email in the next two days asking about your experience. I hope that your e-visit has been valuable and will speed your recovery. ° °5-10 minutes spent reviewing and documenting in chart. ° °

## 2022-11-01 ENCOUNTER — Telehealth: Payer: Commercial Managed Care - PPO | Admitting: Nurse Practitioner

## 2022-11-01 DIAGNOSIS — B9689 Other specified bacterial agents as the cause of diseases classified elsewhere: Secondary | ICD-10-CM | POA: Diagnosis not present

## 2022-11-01 DIAGNOSIS — J028 Acute pharyngitis due to other specified organisms: Secondary | ICD-10-CM

## 2022-11-01 MED ORDER — AMOXICILLIN 500 MG PO TABS: 500.0000 mg | ORAL_TABLET | Freq: Two times a day (BID) | ORAL | 0 refills | Status: DC

## 2022-11-01 NOTE — Progress Notes (Signed)

## 2022-11-14 LAB — HM PAP SMEAR

## 2023-03-02 ENCOUNTER — Telehealth: Payer: Commercial Managed Care - PPO | Admitting: Physician Assistant

## 2023-03-02 DIAGNOSIS — J020 Streptococcal pharyngitis: Secondary | ICD-10-CM

## 2023-03-02 MED ORDER — AMOXICILLIN 500 MG PO CAPS: 500.0000 mg | ORAL_CAPSULE | Freq: Two times a day (BID) | ORAL | 0 refills | Status: AC

## 2023-03-02 NOTE — Progress Notes (Signed)

## 2023-05-13 ENCOUNTER — Telehealth: Payer: Commercial Managed Care - PPO | Admitting: Physician Assistant

## 2023-05-13 DIAGNOSIS — H6691 Otitis media, unspecified, right ear: Secondary | ICD-10-CM | POA: Diagnosis not present

## 2023-05-13 MED ORDER — AMOXICILLIN 875 MG PO TABS: 875.0000 mg | ORAL_TABLET | Freq: Two times a day (BID) | ORAL | 0 refills | Status: AC

## 2023-05-13 NOTE — Progress Notes (Signed)
E-Visit for Ear Pain - Acute Otitis Media   We are sorry that you are not feeling well. Here is how we plan to help!  Based on what you have shared with me it looks like you have Acute Otitis Media.  Acute Otitis Media is an infection of the middle or "inner" ear. This type of infection can cause redness, inflammation, and fluid buildup behind the tympanic membrane (ear drum).  The usual symptoms include: Earache/Pain Fever Upper respiratory symptoms Lack of energy/Fatigue/Malaise Slight hearing loss gradually worsening- if the inner ear fills with fluid What causes middle ear infections? Most middle ear infections occur when an infection such as a cold, leads to a build-up of mucus in the middle ear and causes the Eustachian tube (a thin tube that runs from the middle ear to the back of the nose) to become swollen or blocked.   This means mucus can't drain away properly, making it easier for an infection to spread into the middle ear.  How middle ear infections are treated: Most ear infections clear up within three to five days and don't need any specific treatment. If necessary, tylenol or ibuprofen should be used to relieve pain and a high temperature.  If you develop a fever higher than 102, or any significantly worsening symptoms, this could indicate a more serious infection moving to the middle/inner and needs face to face evaluation in an office by a provider.   Antibiotics aren't routinely used to treat middle ear infections, although they may occasionally be prescribed if symptoms persist or are particularly severe. Given your presentation,   I have prescribed Amoxicillin 875 mg one tablet twice daily for 10 days   Your symptoms should improve over the next 3 days and should resolve in about 7 days. Be sure to complete ALL of the prescription(s) given.  HOME CARE: Wash your hands frequently. If you are prescribed an ear drop, do not place the tip of the bottle on your ear or  touch it with your fingers. You can take Acetaminophen 650 mg every 4-6 hours as needed for pain.  If pain is severe or moderate, you can apply a heating pad (set on low) or hot water bottle (wrapped in a towel) to outer ear for 20 minutes.  This will also increase drainage.  GET HELP RIGHT AWAY IF: Fever is over 102.2 degrees. You develop progressive ear pain or hearing loss. Ear symptoms persist longer than 3 days after treatment.  MAKE SURE YOU: Understand these instructions. Will watch your condition. Will get help right away if you are not doing well or get worse.  Thank you for choosing an e-visit.  Your e-visit answers were reviewed by a board certified advanced clinical practitioner to complete your personal care plan. Depending upon the condition, your plan could have included both over the counter or prescription medications.  Please review your pharmacy choice. Make sure the pharmacy is open so you can pick up the prescription now. If there is a problem, you may contact your provider through MyChart messaging and have the prescription routed to another pharmacy.  Your safety is important to us. If you have drug allergies check your prescription carefully.   For the next 24 hours you can use MyChart to ask questions about today's visit, request a non-urgent call back, or ask for a work or school excuse. You will get an email with a survey after your eVisit asking about your experience. We would appreciate your feedback. I hope   that your e-visit has been valuable and will aid in your recovery.   

## 2023-05-13 NOTE — Progress Notes (Signed)
I have spent 5 minutes in review of e-visit questionnaire, review and updating patient chart, medical decision making and response to patient.   Mia Milan Cody Jacklynn Dehaas, PA-C    

## 2023-07-16 ENCOUNTER — Telehealth: Payer: Commercial Managed Care - PPO

## 2023-07-16 ENCOUNTER — Telehealth: Payer: Commercial Managed Care - PPO | Admitting: Physician Assistant

## 2023-07-16 DIAGNOSIS — J020 Streptococcal pharyngitis: Secondary | ICD-10-CM

## 2023-07-16 MED ORDER — AMOXICILLIN 500 MG PO CAPS: 500.0000 mg | ORAL_CAPSULE | Freq: Two times a day (BID) | ORAL | 0 refills | Status: AC

## 2023-07-16 NOTE — Progress Notes (Signed)

## 2023-08-13 ENCOUNTER — Encounter: Payer: Self-pay | Admitting: Family Medicine

## 2023-08-13 ENCOUNTER — Ambulatory Visit: Payer: Commercial Managed Care - PPO | Admitting: Family Medicine

## 2023-08-13 VITALS — BP 116/62 | HR 66 | Temp 98.2°F | Ht 66.0 in | Wt 170.1 lb

## 2023-08-13 DIAGNOSIS — Z23 Encounter for immunization: Secondary | ICD-10-CM | POA: Diagnosis not present

## 2023-08-13 DIAGNOSIS — J4520 Mild intermittent asthma, uncomplicated: Secondary | ICD-10-CM

## 2023-08-13 DIAGNOSIS — Z113 Encounter for screening for infections with a predominantly sexual mode of transmission: Secondary | ICD-10-CM

## 2023-08-13 MED ORDER — ALBUTEROL SULFATE HFA 108 (90 BASE) MCG/ACT IN AERS: 2.0000 | INHALATION_SPRAY | RESPIRATORY_TRACT | 5 refills | Status: AC | PRN

## 2023-08-13 NOTE — Progress Notes (Signed)
Subjective:    Patient ID: Danielle Mendoza, female    DOB: 01-15-1996, 28 y.o.   MRN: [id number]  HPI  Wt Readings from Last 3 Encounters:  08/13/23 170 lb 2 oz (77.2 kg)  04/26/20 188 lb (85.3 kg)  02/21/19 182 lb 9 oz (82.8 kg)   27.46 kg/m  Vitals:   08/13/23 1124  BP: 116/62  Pulse: 66  Temp: 98.2 F (36.8 C)  SpO2: 99%    Pt presents for need for STD testing Also refill of her inhaler  Also for flu shot   Feeling ok  Busy at work   History of asthma  Uses flovent 44 mcg 2 puffs bid -no longer needs  Rescue inhaler-albuterol / does not have to use often but cold air is a trigger [place] exercising    Desires STD screening  No symptoms  Has had a new partner since last visit  No known exposure  No IV drug use    Remote history of chlamydia in past-treated   Has mirena IUD ([facility]) in 2020 , good until 02/2027   Had gyn visit with Dr Elon Spanner 10/2022 and had STD screening  Neg RPR Neg HBsag Neg Hep C Neg HIV    Pap was 05/2018 with neg STD screen   Possible pap 07/2021 , then 10/2022  Thinks she had colp and HPV - all came out fine  Will go back in April      Patient Active Problem List   Diagnosis Date Noted   General counseling and advice on contraceptive management 02/21/2019   Breast lump on right side at 3 o'clock position 06/08/2018   Allergy 06/18/2017   Nasal turbinate hypertrophy 06/18/2017   Routine general medical examination at a health care facility 11/03/2016   Encounter for routine gynecological examination 11/03/2016   Screen for STD (sexually transmitted disease) 07/03/2014   Well adolescent visit 11/24/2012   Acne 11/11/2011   CARDIAC MURMUR 09/05/2008   Asthma 12/07/2006   History reviewed. No pertinent past medical history. History reviewed. No pertinent surgical history. Social History   Tobacco Use   Smoking status: Never   Smokeless tobacco: Never  Substance Use Topics   Alcohol use: No     Alcohol/week: 0.0 standard drinks of alcohol   Drug use: No   History reviewed. No pertinent family history. No Known Allergies Current Outpatient Medications on File Prior to Visit  Medication Sig Dispense Refill   fluticasone (FLONASE) 50 MCG/ACT nasal spray Place 1 spray into both nostrils daily as needed for allergies or rhinitis.     levonorgestrel (MIRENA, 52 MG,) 20 MCG/24HR IUD Mirena 20 mcg/24 hours (6 yrs) 52 mg intrauterine device  Take 1 device by intrauterine route.     No current facility-administered medications on file prior to visit.    Review of Systems  Constitutional:  Negative for activity change, appetite change, fatigue, fever and unexpected weight change.  HENT:  Negative for congestion, ear pain, rhinorrhea, sinus pressure and sore throat.   Eyes:  Negative for pain, redness and visual disturbance.  Respiratory:  Negative for cough, shortness of breath and wheezing.   Cardiovascular:  Negative for chest pain and palpitations.  Gastrointestinal:  Negative for abdominal pain, blood in stool, constipation and diarrhea.  Endocrine: Negative for polydipsia and polyuria.  Genitourinary:  Negative for dysuria, frequency and urgency.  Musculoskeletal:  Negative for arthralgias, back pain and myalgias.  Skin:  Negative for pallor and rash.  Allergic/Immunologic:  Negative for environmental allergies.  Neurological:  Negative for dizziness, syncope and headaches.  Hematological:  Negative for adenopathy. Does not bruise/bleed easily.  Psychiatric/Behavioral:  Negative for decreased concentration and dysphoric mood. The patient is not nervous/anxious.        Objective:   Physical Exam Constitutional:      General: She is not in acute distress.    Appearance: Normal appearance. She is well-developed and normal weight. She is not ill-appearing or diaphoretic.  HENT:     Head: Normocephalic and atraumatic.  Eyes:     Conjunctiva/sclera: Conjunctivae normal.      Pupils: Pupils are equal, round, and reactive to light.  Neck:     Thyroid: No thyromegaly.     Vascular: No carotid bruit or JVD.  Cardiovascular:     Rate and Rhythm: Normal rate and regular rhythm.     Heart sounds: Normal heart sounds.     No gallop.  Pulmonary:     Effort: Pulmonary effort is normal. No respiratory distress.     Breath sounds: Normal breath sounds. No wheezing or rales.  Abdominal:     General: There is no distension or abdominal bruit.     Palpations: Abdomen is soft. There is no hepatomegaly or splenomegaly.     Tenderness: There is no abdominal tenderness.     Comments: No suprapubic tenderness or fullness    Musculoskeletal:     Cervical back: Normal range of motion and neck supple.     Right lower leg: No edema.     Left lower leg: No edema.  Lymphadenopathy:     Cervical: No cervical adenopathy.  Skin:    General: Skin is warm and dry.     Coloration: Skin is not jaundiced or pale.     Findings: No erythema or rash.  Neurological:     Mental Status: She is alert.     Coordination: Coordination normal.     Deep Tendon Reflexes: Reflexes are normal and symmetric. Reflexes normal.  Psychiatric:        Mood and Affect: Mood normal.           Assessment & Plan:   Problem List Items Addressed This Visit       Respiratory   Asthma   No longer needing flovent- removed from her list  Triggers include cold air and exercise   Uses albuterol mdi infrequently but is out and needs refill   Overall doing well       Relevant Medications   albuterol (VENTOLIN HFA) 108 (90 Base) MCG/ACT inhaler     Other   Screen for STD (sexually transmitted disease) - Primary   Has had a new partner since last testing  No symptoms No known exposure  No history of IV drug abuse Had chlamydia years ago/ was treated Mirena iud since 2020 Last pap /visit with gyn / Dr Elon Spanner was in April (history of HPV she thinks /one abn pap with colp but thinks last one was  ok) we will send for those results  Reviewed neg STD screen from 10/2022 with gyn  Lab today for std screen  Discussed safe sexual practices/use of condoms       Relevant Orders   C. trachomatis/N. gonorrhoeae RNA   Hepatitis C antibody   HIV Antibody (routine testing w rflx)   RPR   Other Visit Diagnoses       Need for influenza vaccination       Relevant Orders  Flu vaccine trivalent PF, 6mos and older(Flulaval,Afluria,Fluarix,Fluzone) (Completed)

## 2023-08-13 NOTE — Patient Instructions (Signed)
Flu shot today  STD screen today   Use condoms for std prevention   Take care of yourself   If asthma gets worse and you want to return to flovent let me know

## 2023-08-14 ENCOUNTER — Encounter: Payer: Self-pay | Admitting: Family Medicine

## 2023-08-14 NOTE — Assessment & Plan Note (Signed)
No longer needing flovent- removed from her list  Triggers include cold air and exercise   Uses albuterol mdi infrequently but is out and needs refill   Overall doing well

## 2023-08-14 NOTE — Assessment & Plan Note (Signed)
Has had a new partner since last testing  No symptoms No known exposure  No history of IV drug abuse Had chlamydia years ago/ was treated Mirena iud since 2020 Last pap /visit with gyn / Dr Elon Spanner was in April (history of HPV she thinks /one abn pap with colp but thinks last one was ok) we will send for those results  Reviewed neg STD screen from 10/2022 with gyn  Lab today for std screen  Discussed safe sexual practices/use of condoms

## 2023-08-15 LAB — HIV ANTIBODY (ROUTINE TESTING W REFLEX): HIV 1&2 Ab, 4th Generation: NONREACTIVE

## 2023-08-15 LAB — C. TRACHOMATIS/N. GONORRHOEAE RNA
C. trachomatis RNA, TMA: NOT DETECTED
N. gonorrhoeae RNA, TMA: NOT DETECTED

## 2023-08-15 LAB — HEPATITIS C ANTIBODY: Hepatitis C Ab: NONREACTIVE

## 2023-08-15 LAB — RPR: RPR Ser Ql: NONREACTIVE

## 2023-08-17 ENCOUNTER — Encounter: Payer: Self-pay | Admitting: Family Medicine

## 2023-09-22 ENCOUNTER — Telehealth: Admitting: Physician Assistant

## 2023-09-22 DIAGNOSIS — H6991 Unspecified Eustachian tube disorder, right ear: Secondary | ICD-10-CM

## 2023-09-22 MED ORDER — FLUTICASONE PROPIONATE 50 MCG/ACT NA SUSP: 2.0000 | Freq: Every day | NASAL | 0 refills | Status: AC

## 2023-09-22 NOTE — Progress Notes (Signed)

## 2024-02-23 ENCOUNTER — Telehealth: Admitting: Physician Assistant

## 2024-02-23 DIAGNOSIS — J029 Acute pharyngitis, unspecified: Secondary | ICD-10-CM | POA: Diagnosis not present

## 2024-02-23 MED ORDER — AMOXICILLIN 500 MG PO CAPS: 500.0000 mg | ORAL_CAPSULE | Freq: Two times a day (BID) | ORAL | 0 refills | Status: AC

## 2024-02-23 NOTE — Progress Notes (Signed)

## 2024-07-25 ENCOUNTER — Telehealth: Admitting: Physician Assistant

## 2024-07-25 DIAGNOSIS — J029 Acute pharyngitis, unspecified: Secondary | ICD-10-CM

## 2024-07-25 MED ORDER — AMOXICILLIN 500 MG PO CAPS: 500.0000 mg | ORAL_CAPSULE | Freq: Three times a day (TID) | ORAL | 0 refills | Status: AC

## 2024-07-25 NOTE — Progress Notes (Signed)
